# Patient Record
Sex: Male | Born: 1960 | Race: White | Hispanic: No | Smoking: Former smoker
Health system: Southern US, Community
[De-identification: ages and names within clinical notes are randomized; demographics above are authoritative.]

## PROBLEM LIST (undated history)

## (undated) DIAGNOSIS — J189 Pneumonia, unspecified organism: Secondary | ICD-10-CM

## (undated) DIAGNOSIS — I2699 Other pulmonary embolism without acute cor pulmonale: Secondary | ICD-10-CM

## (undated) DIAGNOSIS — B029 Zoster without complications: Secondary | ICD-10-CM

## (undated) DIAGNOSIS — I82431 Acute embolism and thrombosis of right popliteal vein: Secondary | ICD-10-CM

## (undated) HISTORY — DX: Acute embolism and thrombosis of right popliteal vein: I82.431

## (undated) HISTORY — PX: KNEE SURGERY: SHX244

## (undated) HISTORY — PX: HAND SURGERY: SHX662

---

## 2007-03-21 DIAGNOSIS — J189 Pneumonia, unspecified organism: Secondary | ICD-10-CM

## 2007-03-21 HISTORY — DX: Pneumonia, unspecified organism: J18.9

## 2011-07-06 ENCOUNTER — Emergency Department (HOSPITAL_COMMUNITY): Payer: Medicaid Other | Admitting: Critical Care Medicine

## 2011-07-06 ENCOUNTER — Encounter (HOSPITAL_COMMUNITY): Payer: Self-pay | Admitting: Critical Care Medicine

## 2011-07-06 ENCOUNTER — Emergency Department (HOSPITAL_COMMUNITY): Payer: Medicaid Other

## 2011-07-06 ENCOUNTER — Emergency Department (HOSPITAL_COMMUNITY)
Admission: EM | Admit: 2011-07-06 | Discharge: 2011-07-07 | Disposition: A | Discharge status: Home / Self Care | Payer: Medicaid Other | Attending: Emergency Medicine | Admitting: Emergency Medicine

## 2011-07-06 ENCOUNTER — Encounter (HOSPITAL_COMMUNITY): Payer: Self-pay | Admitting: *Deleted

## 2011-07-06 ENCOUNTER — Encounter (HOSPITAL_COMMUNITY)
Admission: EM | Disposition: A | Discharge status: Home / Self Care | Payer: Self-pay | Source: Home / Self Care | Attending: Emergency Medicine

## 2011-07-06 DIAGNOSIS — S61219A Laceration without foreign body of unspecified finger without damage to nail, initial encounter: Secondary | ICD-10-CM

## 2011-07-06 DIAGNOSIS — Z23 Encounter for immunization: Secondary | ICD-10-CM | POA: Insufficient documentation

## 2011-07-06 DIAGNOSIS — W312XXA Contact with powered woodworking and forming machines, initial encounter: Secondary | ICD-10-CM | POA: Insufficient documentation

## 2011-07-06 DIAGNOSIS — Y92009 Unspecified place in unspecified non-institutional (private) residence as the place of occurrence of the external cause: Secondary | ICD-10-CM | POA: Insufficient documentation

## 2011-07-06 DIAGNOSIS — S62609B Fracture of unspecified phalanx of unspecified finger, initial encounter for open fracture: Secondary | ICD-10-CM

## 2011-07-06 DIAGNOSIS — S62639B Displaced fracture of distal phalanx of unspecified finger, initial encounter for open fracture: Secondary | ICD-10-CM | POA: Insufficient documentation

## 2011-07-06 HISTORY — PX: INCISION AND DRAINAGE OF WOUND: SHX1803

## 2011-07-06 LAB — CBC
MCHC: 33.9 g/dL (ref 30.0–36.0)
Platelets: 308 10*3/uL (ref 150–400)
RDW: 13.1 % (ref 11.5–15.5)
WBC: 10.3 10*3/uL (ref 4.0–10.5)

## 2011-07-06 LAB — DIFFERENTIAL
Basophils Absolute: 0 10*3/uL (ref 0.0–0.1)
Basophils Relative: 0 % (ref 0–1)
Lymphocytes Relative: 30 % (ref 12–46)
Neutro Abs: 6 10*3/uL (ref 1.7–7.7)
Neutrophils Relative %: 58 % (ref 43–77)

## 2011-07-06 LAB — BASIC METABOLIC PANEL
BUN: 17 mg/dL (ref 6–23)
GFR calc Af Amer: >90 mL/min (ref >90)
GFR calc non Af Amer: >90 mL/min (ref >90)
Potassium: 3.6 mEq/L (ref 3.5–5.1)
Sodium: 137 mEq/L (ref 135–145)

## 2011-07-06 SURGERY — OPEN REDUCTION INTERNAL FIXATION (ORIF) HAND
Anesthesia: General | Site: Hand | Laterality: Left | Wound class: Contaminated

## 2011-07-06 MED ORDER — DEXTROSE 5 % IV SOLN
INTRAVENOUS | Status: DC | PRN
  Administered 2011-07-06: 22:00:00 via INTRAVENOUS

## 2011-07-06 MED ORDER — CEFAZOLIN SODIUM-DEXTROSE 2-3 GM-% IV SOLR
2.0000 g | Freq: Once | INTRAVENOUS | Status: AC
  Administered 2011-07-06: 2 g via INTRAVENOUS
  Filled 2011-07-06: qty 50

## 2011-07-06 MED ORDER — PROPOFOL 10 MG/ML IV EMUL
INTRAVENOUS | Status: DC | PRN
  Administered 2011-07-06: 200 mg via INTRAVENOUS

## 2011-07-06 MED ORDER — ONDANSETRON HCL 4 MG/2ML IJ SOLN
4.0000 mg | Freq: Once | INTRAMUSCULAR | Status: AC
  Administered 2011-07-06: 4 mg via INTRAVENOUS
  Filled 2011-07-06: qty 2

## 2011-07-06 MED ORDER — OXYCODONE-ACETAMINOPHEN 5-325 MG PO TABS: ORAL_TABLET | ORAL | Status: AC

## 2011-07-06 MED ORDER — FENTANYL CITRATE 0.05 MG/ML IJ SOLN
50.0000 ug | Freq: Once | INTRAMUSCULAR | Status: AC
  Administered 2011-07-06: 50 ug via INTRAVENOUS
  Filled 2011-07-06: qty 2

## 2011-07-06 MED ORDER — BUPIVACAINE HCL (PF) 0.25 % IJ SOLN
INTRAMUSCULAR | Status: DC | PRN
  Administered 2011-07-06: 20 mL

## 2011-07-06 MED ORDER — TETANUS-DIPHTH-ACELL PERTUSSIS 5-2.5-18.5 LF-MCG/0.5 IM SUSP
0.5000 mL | Freq: Once | INTRAMUSCULAR | Status: AC
  Administered 2011-07-06: 0.5 mL via INTRAMUSCULAR
  Filled 2011-07-06: qty 0.5

## 2011-07-06 MED ORDER — FENTANYL CITRATE 0.05 MG/ML IJ SOLN
50.0000 ug | Freq: Once | INTRAMUSCULAR | Status: AC
  Administered 2011-07-06: 50 ug via INTRAVENOUS

## 2011-07-06 MED ORDER — HYDROMORPHONE HCL PF 1 MG/ML IJ SOLN
1.0000 mg | Freq: Once | INTRAMUSCULAR | Status: AC
  Administered 2011-07-06: 1 mg via INTRAVENOUS
  Filled 2011-07-06: qty 1

## 2011-07-06 MED ORDER — FENTANYL CITRATE 0.05 MG/ML IJ SOLN
INTRAMUSCULAR | Status: DC | PRN
  Administered 2011-07-06: 150 ug via INTRAVENOUS
  Administered 2011-07-06: 100 ug via INTRAVENOUS
  Administered 2011-07-06: 50 ug via INTRAVENOUS
  Administered 2011-07-06: 100 ug via INTRAVENOUS

## 2011-07-06 MED ORDER — LACTATED RINGERS IV SOLN
INTRAVENOUS | Status: DC | PRN
  Administered 2011-07-06 (×2): via INTRAVENOUS

## 2011-07-06 MED ORDER — CEFAZOLIN SODIUM 1-5 GM-% IV SOLN
INTRAVENOUS | Status: DC | PRN
  Administered 2011-07-06: 1 g via INTRAVENOUS

## 2011-07-06 MED ORDER — MIDAZOLAM HCL 5 MG/5ML IJ SOLN
INTRAMUSCULAR | Status: DC | PRN
  Administered 2011-07-06: 2 mg via INTRAVENOUS

## 2011-07-06 MED ORDER — GLYCOPYRROLATE 0.2 MG/ML IJ SOLN
INTRAMUSCULAR | Status: DC | PRN
  Administered 2011-07-06: .8 mg via INTRAVENOUS

## 2011-07-06 MED ORDER — ONDANSETRON HCL 4 MG/2ML IJ SOLN
INTRAMUSCULAR | Status: DC | PRN
  Administered 2011-07-06: 4 mg via INTRAVENOUS

## 2011-07-06 MED ORDER — ROCURONIUM BROMIDE 100 MG/10ML IV SOLN
INTRAVENOUS | Status: DC | PRN
  Administered 2011-07-06: 50 mg via INTRAVENOUS

## 2011-07-06 MED ORDER — SULFAMETHOXAZOLE-TRIMETHOPRIM 800-160 MG PO TABS: 1.0000 | ORAL_TABLET | Freq: Two times a day (BID) | ORAL | Status: AC

## 2011-07-06 MED ORDER — NEOSTIGMINE METHYLSULFATE 1 MG/ML IJ SOLN
INTRAMUSCULAR | Status: DC | PRN
  Administered 2011-07-06: 4 mg via INTRAVENOUS

## 2011-07-06 MED ORDER — FENTANYL CITRATE 0.05 MG/ML IJ SOLN
50.0000 ug | Freq: Once | INTRAMUSCULAR | Status: AC
  Filled 2011-07-06: qty 2

## 2011-07-06 MED ORDER — SUCCINYLCHOLINE CHLORIDE 20 MG/ML IJ SOLN
INTRAMUSCULAR | Status: DC | PRN
  Administered 2011-07-06: 120 mg via INTRAVENOUS

## 2011-07-06 SURGICAL SUPPLY — 30 items
BANDAGE ELASTIC 3 VELCRO ST LF (GAUZE/BANDAGES/DRESSINGS) ×3 IMPLANT
BANDAGE GAUZE ELAST BULKY 4 IN (GAUZE/BANDAGES/DRESSINGS) ×3 IMPLANT
CUFF TOURNIQUET SINGLE 18IN (TOURNIQUET CUFF) ×3 IMPLANT
DRAPE SURG 17X23 STRL (DRAPES) ×3 IMPLANT
DRSG EMULSION OIL 3X3 NADH (GAUZE/BANDAGES/DRESSINGS) ×3 IMPLANT
GAUZE XEROFORM 1X8 LF (GAUZE/BANDAGES/DRESSINGS) ×3 IMPLANT
GLOVE BIO SURGEON STRL SZ 6 (GLOVE) ×3 IMPLANT
GLOVE BIOGEL PI IND STRL 6.5 (GLOVE) ×2 IMPLANT
GLOVE BIOGEL PI IND STRL 8 (GLOVE) ×2 IMPLANT
GLOVE BIOGEL PI INDICATOR 6.5 (GLOVE) ×1
GLOVE BIOGEL PI INDICATOR 8 (GLOVE) ×1
GLOVE ORTHO TXT STRL SZ7.5 (GLOVE) ×3 IMPLANT
GOWN PREVENTION PLUS XLARGE (GOWN DISPOSABLE) ×3 IMPLANT
GOWN STRL REIN XL XLG (GOWN DISPOSABLE) ×3 IMPLANT
HAND ALUMI LG (SOFTGOODS) ×3 IMPLANT
KIT BASIN OR (CUSTOM PROCEDURE TRAY) ×3 IMPLANT
KWIRE 4.0 X .035IN (WIRE) ×6 IMPLANT
MATRIX SURGICAL PSMX 10X15CM (Tissue) ×3 IMPLANT
MICROMATRIX 500MG (Tissue) ×3 IMPLANT
PACK ORTHO EXTREMITY (CUSTOM PROCEDURE TRAY) ×3 IMPLANT
SCRUB BETADINE 4OZ XXX (MISCELLANEOUS) ×3 IMPLANT
SET CYSTO W/LG BORE CLAMP LF (SET/KITS/TRAYS/PACK) ×3 IMPLANT
SOLUTION BETADINE 4OZ (MISCELLANEOUS) ×3 IMPLANT
SOLUTION PARTIC MCRMTRX 500MG (Tissue) ×2 IMPLANT
SPLINT PLASTER EXTRA FAST 3X15 (CAST SUPPLIES) ×10
SPLINT PLASTER GYPS XFAST 3X15 (CAST SUPPLIES) ×20 IMPLANT
SPONGE GAUZE 4X4 12PLY (GAUZE/BANDAGES/DRESSINGS) ×3 IMPLANT
SUT ETHILON 5 0 PS 2 18 (SUTURE) ×3 IMPLANT
SUT MON AB 5-0 PS2 18 (SUTURE) ×6 IMPLANT
SUT VICRYL 4-0 PS2 18IN ABS (SUTURE) ×3 IMPLANT

## 2011-07-06 NOTE — ED Notes (Signed)
Pt. s wife had to go home.  Please call her when pt. Is ready for discharge./  [phone]/Home or Cell is [phone]

## 2011-07-06 NOTE — ED Notes (Signed)
Pt. Arrived via ambulance, [facility], with a lt. Hand injury from a Table Saw.  Pt. Has a dressing intact and no bleeding noted.   Pt. Is alert and oriented X3.  Pt. Is aware of meeting Dr. Jessy Oto for surgery.  I explained to pt. That we would keep him comfortable until Dr. Jessy Oto arrives.

## 2011-07-06 NOTE — ED Notes (Signed)
Placed call to OR. States that they should be calling for him within the hour. Pt requesting pain medication. NP notified.

## 2011-07-06 NOTE — Anesthesia Preprocedure Evaluation (Addendum)
Anesthesia Evaluation  Patient identified by MRN, date of birth, ID band Patient awake    Reviewed: Allergy & Precautions, H&P , NPO status , Patient's Chart, lab work & pertinent test results  Airway Mallampati: I TM Distance: >3 FB Neck ROM: Full    Dental  (+) Dental Advisory Given and Chipped,    Pulmonary former smoker breath sounds clear to auscultation  Pulmonary exam normal       Cardiovascular negative cardio ROS  Rhythm:Regular Rate:Normal     Neuro/Psych negative neurological ROS     GI/Hepatic Neg liver ROS, GERD-  Poorly Controlled,  Endo/Other  negative endocrine ROS  Renal/GU negative Renal ROS     Musculoskeletal  (+) Arthritis -,   Abdominal (+) + obese,   Peds  Hematology negative hematology ROS (+)   Anesthesia Other Findings   Reproductive/Obstetrics                          Anesthesia Physical Anesthesia Plan  ASA: II and Emergent  Anesthesia Plan: General   Post-op Pain Management:    Induction: Intravenous  Airway Management Planned: Oral ETT  Additional Equipment:   Intra-op Plan:   Post-operative Plan: Extubation in OR  Informed Consent: I have reviewed the patients History and Physical, chart, labs and discussed the procedure including the risks, benefits and alternatives for the proposed anesthesia with the patient or authorized representative who has indicated his/her understanding and acceptance.   Dental advisory given  Plan Discussed with: CRNA, Anesthesiologist and Surgeon  Anesthesia Plan Comments: (Plan routine monitors, GETA)      Anesthesia Quick Evaluation

## 2011-07-06 NOTE — Op Note (Signed)
Dictation [id number]

## 2011-07-06 NOTE — ED Provider Notes (Signed)
History  This chart was scribed for Flint Melter, MD by Cherlynn Perches. The patient was seen in room APA19/[facility]19. Patient's care was started at 1040.   CSN: [id number]  Arrival date & time 07/06/11  1040   None     Chief Complaint  Patient presents with  . Extremity Laceration    (Consider location/radiation/quality/duration/timing/severity/associated sxs/prior treatment) HPI [name] is a 50 y.o. male who presents to the Emergency Department complaining of severe laceration to left hand involving 3rd, 4th, and 5th fingers sustained just prior to arrival while using a table saw. Third fingers is partially amputated. Pressure applied prior to arrival. Denies associated syncope or additional injuries. Tetanus not up to date.    History reviewed. No pertinent past medical history.  History reviewed. No pertinent past surgical history.  No family history on file.  History  Substance Use Topics  . Smoking status: Never Smoker   . Smokeless tobacco: Not on file  . Alcohol Use: No      Review of Systems A complete 10 system review of systems was obtained and all systems are negative except as noted in the HPI and PMH.    Allergies  Review of patient's allergies indicates no known allergies.  Home Medications  No current outpatient prescriptions on file.  BP 145/94  Pulse 60  Temp(Src) 98.6 F (37 C) (Oral)  Resp 20  SpO2 97%  Physical Exam  Nursing note and vitals reviewed. Constitutional: He is oriented to person, place, and time. He appears well-developed and well-nourished.  HENT:  Head: Normocephalic and atraumatic.  Right Ear: External ear normal.  Left Ear: External ear normal.  Eyes: Conjunctivae and EOM are normal. Pupils are equal, round, and reactive to Duzan.  Neck: Normal range of motion and phonation normal. Neck supple.  Cardiovascular: Normal rate, regular rhythm, normal heart sounds and intact distal pulses.   Pulmonary/Chest: Effort  normal and breath sounds normal. He exhibits no bony tenderness.  Abdominal: Soft. Normal appearance. There is no tenderness.  Musculoskeletal: Normal range of motion.       Left 3rd finger - Large laceration on middle aspect of finger extending tangentially with 60% amputation. Fingertip is ischemic. Left 4th finger - evulsion at PIP joint laterally, sparing nail Left 5th finger - dorsal laceration at PIP joint.  Neurological: He is alert and oriented to person, place, and time. He has normal strength. No cranial nerve deficit or sensory deficit. He exhibits normal muscle tone. Coordination normal.  Skin: Skin is warm, dry and intact.  Psychiatric: He has a normal mood and affect. His behavior is normal. Judgment and thought content normal.    ED Course  Procedures (including critical care time)  DIAGNOSTIC STUDIES:   COORDINATION OF CARE: 10:53AM - Discussed need for hand surgery today with pt. Patient understands and agrees with initial ED impression and plan with expectations set for ED visit. 11:39AM- Consult complete with hand surgeon. Patient case explained and discussed.   Emergency department treatment: IV fluids, IV, Dilaudid, IV, Ancef, and TDAP.    Labs Reviewed  BASIC METABOLIC PANEL - Abnormal; Notable for the following:    Glucose, Bld 120 (*)    All other components within normal limits  CBC  DIFFERENTIAL   Dg Hand Complete Left  07/06/2011  *RADIOLOGY REPORT*  Clinical Data: Pain and laceration after table saw injury  LEFT HAND - COMPLETE 3+ VIEW  Comparison: None.  Findings: There is a comminuted fracture of the third distal  phalanx.  There are also chip fractures of the distal third middle phalanx and the distal fourth middle phalanx and the proximal fourth distal phalanx.  Overlying soft tissue irregularity is present.  IMPRESSION: There are fractures involving the third and fourth distal and middle phalanges.  Original Report Authenticated By: Brandon Melnick,  M.D.     1. Open finger fracture   2. Finger laceration       MDM  Accidental injury, left fingers 3,4,5 from table saw encounter. He needs surgical repair by a specialist. No other apparent injuries. The right distal tip near complete amputation will likely need revision and shortening. The remaining tissue is nonviable.      I personally performed the services described in this documentation, which was scribed in my presence. The recorded information has been reviewed and considered.    Flint Melter, MD 07/06/11 [phone]

## 2011-07-06 NOTE — Preoperative (Signed)
Beta Blockers   Reason not to administer Beta Blockers:Not Applicable 

## 2011-07-06 NOTE — Anesthesia Procedure Notes (Addendum)
Procedure Name: Intubation Date/Time: 07/06/2011 10:15 PM Performed by: Alexah Kivett S Pre-anesthesia Checklist: Patient identified, Emergency Drugs available, Suction available, Patient being monitored and Timeout performed Patient Re-evaluated:Patient Re-evaluated prior to inductionOxygen Delivery Method: Circle system utilized Preoxygenation: Pre-oxygenation with 100% oxygen Intubation Type: IV induction Laryngoscope Size: Mac and 4 Grade View: Grade I Tube type: Oral Tube size: 7.5 mm Number of attempts: 1 Airway Equipment and Method: Stylet Placement Confirmation: ETT inserted through vocal cords under direct vision,  positive ETCO2 and breath sounds checked- equal and bilateral Secured at: 22 cm Tube secured with: Tape Dental Injury: Teeth and Oropharynx as per pre-operative assessment    Performed by: Shellsea Borunda S

## 2011-07-06 NOTE — ED Notes (Signed)
Pt transported to OR

## 2011-07-06 NOTE — ED Notes (Signed)
Jantzen Pilger, Pt.s wife, [phone] Home number and Cell phone [phone]

## 2011-07-06 NOTE — ED Notes (Signed)
NPO maintained.

## 2011-07-06 NOTE — Discharge Instructions (Signed)

## 2011-07-06 NOTE — H&P (Signed)
[name] is an 51 y.o. male.   Chief Complaint: tablesaw injury left hand HPI: 51 yo rhd male injured left long, ring, and small fingers.  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  Tissue loss on dorsoulnar aspect of ring.  Reports no previous injuries to left hand and no other injuries at this time.  History reviewed. No pertinent past medical history.  History reviewed. No pertinent past surgical history.  No family history on file. Social History:  reports that he has never smoked. He does not have any smokeless tobacco history on file. He reports that he does not drink alcohol or use illicit drugs.  Allergies: No Known Allergies  Medications Prior to Admission  Medication Dose Route Frequency Provider Last Rate Last Dose  . ceFAZolin (ANCEF) IVPB 2 g/50 mL premix  2 g Intravenous Once Flint Melter, MD   2 g at 07/06/11 1207  . fentaNYL (SUBLIMAZE) injection 50 mcg  50 mcg Intravenous Once Fayrene Helper, PA-C   50 mcg at 07/06/11 1604  . fentaNYL (SUBLIMAZE) injection 50 mcg  50 mcg Intravenous Once Tami Ribas, MD   50 mcg at 07/06/11 1703  . fentaNYL (SUBLIMAZE) injection 50 mcg  50 mcg Intravenous Once Tami Ribas, MD      . HYDROmorphone (DILAUDID) injection 1 mg  1 mg Intravenous Once Flint Melter, MD   1 mg at 07/06/11 1110  . HYDROmorphone (DILAUDID) injection 1 mg  1 mg Intravenous Once Flint Melter, MD   1 mg at 07/06/11 1155  . HYDROmorphone (DILAUDID) injection 1 mg  1 mg Intravenous Once Fayrene Helper, PA-C   1 mg at 07/06/11 1336  . ondansetron (ZOFRAN) injection 4 mg  4 mg Intravenous Once Flint Melter, MD   4 mg at 07/06/11 1110  . ondansetron (ZOFRAN) injection 4 mg  4 mg Intravenous Once Fayrene Helper, PA-C   4 mg at 07/06/11 1338  . TDaP (BOOSTRIX) injection 0.5 mL  0.5 mL Intramuscular Once Flint Melter, MD   0.5 mL at 07/06/11 1227   No current outpatient prescriptions on file as of 07/06/2011.    Results for orders placed during the hospital encounter of  07/06/11 (from the past 48 hour(s))  CBC     Status: Normal   Collection Time   07/06/11 11:11 AM      Component Value Range Comment   WBC 10.3  4.0 - 10.5 (K/uL)    RBC 5.02  4.22 - 5.81 (MIL/uL)    Hemoglobin 15.4  13.0 - 17.0 (g/dL)    HCT 91.4  78.2 - 95.6 (%)    MCV 90.4  78.0 - 100.0 (fL)    MCH 30.7  26.0 - 34.0 (pg)    MCHC 33.9  30.0 - 36.0 (g/dL)    RDW 21.3  08.6 - 57.8 (%)    Platelets 308  150 - 400 (K/uL)   DIFFERENTIAL     Status: Normal   Collection Time   07/06/11 11:11 AM      Component Value Range Comment   Neutrophils Relative 58  43 - 77 (%)    Neutro Abs 6.0  1.7 - 7.7 (K/uL)    Lymphocytes Relative 30  12 - 46 (%)    Lymphs Abs 3.1  0.7 - 4.0 (K/uL)    Monocytes Relative 9  3 - 12 (%)    Monocytes Absolute 0.9  0.1 - 1.0 (K/uL)    Eosinophils Relative 3  0 -  5 (%)    Eosinophils Absolute 0.3  0.0 - 0.7 (K/uL)    Basophils Relative 0  0 - 1 (%)    Basophils Absolute 0.0  0.0 - 0.1 (K/uL)    WBC Morphology ATYPICAL LYMPHOCYTES     BASIC METABOLIC PANEL     Status: Abnormal   Collection Time   07/06/11 11:11 AM      Component Value Range Comment   Sodium 137  135 - 145 (mEq/L)    Potassium 3.6  3.5 - 5.1 (mEq/L)    Chloride 103  96 - 112 (mEq/L)    CO2 22  19 - 32 (mEq/L)    Glucose, Bld 120 (*) 70 - 99 (mg/dL)    BUN 17  6 - 23 (mg/dL)    Creatinine, Ser 1.61  0.50 - 1.35 (mg/dL)    Calcium 9.7  8.4 - 10.5 (mg/dL)    GFR calc non Af Amer >90  >90 (mL/min)    GFR calc Af Amer >90  >90 (mL/min)     Dg Hand Complete Left  07/06/2011  *RADIOLOGY REPORT*  Clinical Data: Pain and laceration after table saw injury  LEFT HAND - COMPLETE 3+ VIEW  Comparison: None.  Findings: There is a comminuted fracture of the third distal phalanx.  There are also chip fractures of the distal third middle phalanx and the distal fourth middle phalanx and the proximal fourth distal phalanx.  Overlying soft tissue irregularity is present.  IMPRESSION: There are fractures  involving the third and fourth distal and middle phalanges.  Original Report Authenticated By: Brandon Melnick, M.D.     A comprehensive review of systems was negative.  Blood pressure 133/88, pulse 62, temperature 98.5 F (36.9 C), temperature source Oral, resp. rate 17, SpO2 97.00%.  General appearance: alert, cooperative and appears stated age Head: Normocephalic, without obvious abnormality, atraumatic Neck: supple, symmetrical, trachea midline Resp: clear to auscultation bilaterally Cardio: regular rate and rhythm GI: soft, non-tender; bowel sounds normal; no masses,  no organomegaly Extremities: Space touch sensation and capillary refill intact all fingers except left long finger which has no sensation.  near amputation of long finger at base of nail.  ring finger with tissue loss on dorsoulnar aspect of finger with bone exposed.  active flexion/extension at dip joint.  small finger with wound on volar and dorsal aspect.  active flexion/extension at dip. Pulses: 2+ and symmetric Skin: as above Neurologic: Grossly normal Incision/Wound: As above  Assessment/Plan Left thumb, long, ring, small finger table saw injury.  Thumb and small finger wounds seem into subcutaneous tissue.  Recommend OR for I&Dof wounds, revision amputation long finger, possible skin graft or acell placement on ring finger.  Risks, benefits, and alternatives of surgery were discussed and the patient agrees with the plan of care.   Marelin Tat R 07/06/2011, 5:10 PM

## 2011-07-06 NOTE — ED Provider Notes (Signed)
Pt sent from [facility] to CDU to be seen by hand specialist Dr. Merlyn Lot.  Pt suffered lacerations to his L hand involving 3rd,4th,5th fingers when accidentally cut by a table saw.  Pt receive tetanus shot, pain meds, and Ancef prior to arrival.  He is currently in NAD and pain is controlled.  Will obtain 12 lead ECG.  Will continue to monitor.  VSS.  Pt made NPO  1:19 PM Pt request for pain medication, dilaudid 1mg  and zofran 4mg  given via IV.  Dr. Merlyn Lot is aware that pt is here.     Date: 07/06/2011  Rate: 51  Rhythm: sinus bradycardia  QRS Axis: normal  Intervals: normal  ST/T Wave abnormalities: normal  Conduction Disutrbances:none  Narrative Interpretation:   Old EKG Reviewed: none available  2:38 PM Pt sts his pain is spiking, but he prefers another type of pain medication aside from dilaudid as it makes him uncomfortable.  WIll give Fentanyl IV for pain control.  Pt is lucid and appears to be A&O and in NAD at this time.  Mild bradycardia noted but stable BP.    Fayrene Helper, PA-C 07/07/11 1504

## 2011-07-06 NOTE — ED Notes (Signed)
Call pts wife after surgery to update.

## 2011-07-06 NOTE — Brief Op Note (Signed)
07/06/2011  11:49 PM  PATIENT:  Daphane Shepherd  51 y.o. male  PRE-OPERATIVE DIAGNOSIS:  saw injury  POST-OPERATIVE DIAGNOSIS:  saw injury  PROCEDURE:  Procedure(s) (LRB): IRRIGATION AND DEBRIDEMENT WOUND (Left) OPEN REDUCTION INTERNAL FIXATION (ORIF) HAND (Left)  SURGEON:  Surgeon(s) and Role:    * Tami Ribas, MD - Primary  PHYSICIAN ASSISTANT:   ASSISTANTS: none   ANESTHESIA:   general  EBL:  Total I/O In: 1250 [I.V.:1250] Out: -   BLOOD ADMINISTERED:none  DRAINS: none   LOCAL MEDICATIONS USED:  MARCAINE     SPECIMEN:  No Specimen  DISPOSITION OF SPECIMEN:  N/A  COUNTS:  YES  TOURNIQUET:  * Missing tourniquet times found for documented tourniquets in log:  [id number] *  DICTATION: .Other Dictation: Dictation Number [id number]  PLAN OF CARE: Discharge to home after PACU  PATIENT DISPOSITION:  PACU - hemodynamically stable.

## 2011-07-06 NOTE — ED Notes (Signed)
Cut left middle, ring, and pinky fingers with table saw.  Pt reports loosing significant amt of blood.  Pt has bone exposed to middle finger.

## 2011-07-07 ENCOUNTER — Encounter (HOSPITAL_COMMUNITY): Payer: Self-pay | Admitting: Orthopedic Surgery

## 2011-07-07 MED ORDER — HYDROMORPHONE HCL PF 1 MG/ML IJ SOLN: 0.2500 mg | INTRAMUSCULAR | Status: DC | PRN

## 2011-07-07 NOTE — Anesthesia Postprocedure Evaluation (Signed)
  Anesthesia Post-op Note  Patient: [name]  Procedure(s) Performed: Procedure(s) (LRB): IRRIGATION AND DEBRIDEMENT WOUND (Left) OPEN REDUCTION INTERNAL FIXATION (ORIF) HAND (Left)  Patient Location: PACU  Anesthesia Type: General  Level of Consciousness: awake, alert  and oriented  Airway and Oxygen Therapy: Patient Spontanous Breathing  Post-op Pain: none  Post-op Assessment: Post-op Vital signs reviewed, Patient's Cardiovascular Status Stable, Respiratory Function Stable, Patent Airway, No signs of Nausea or vomiting and Pain level controlled  Post-op Vital Signs: Reviewed and stable  Complications: No apparent anesthesia complications

## 2011-07-07 NOTE — Op Note (Signed)
[name], [name] NO.:  [id number]  MEDICAL RECORD NO.:  [id number]  LOCATION:  [facility]                         FACILITY:  [facility]  PHYSICIAN:  Betha Loa, MD        DATE OF BIRTH:  April 19, 1960  DATE OF PROCEDURE:  07/06/2011 DATE OF DISCHARGE:  07/07/2011                              OPERATIVE REPORT   PREOPERATIVE DIAGNOSIS:  Left long ring and small finger table saw injuries.  POSTOPERATIVE DIAGNOSES:  Left long finger open fracture distal phalanx with bone loss on ulnar side of distal interphalangeal joint.  Left ring finger open fracture of middle and distal phalanges with bone loss on ulnar side of distal interphalangeal joint with soft tissue loss and left small finger open fracture of distal phalanx.  PROCEDURE:   1. Left long, ring, and small finger irrigation and debridement of open fractures 2. Left long finger revision amputation 3. Left ring finger Acell graft placement 4. Left small finger open reduction and pinning of open distal phalanx fracture  SURGEON:  Betha Loa, MD  ASSISTANT:  None.  ANESTHESIA:  General.  IV FLUIDS:  Per anesthesia flow sheet.  ESTIMATED BLOOD LOSS:  Minimal.  COMPLICATIONS:  None.  SPECIMENS:  None.  TOURNIQUET TIME:  73 minutes.  DISPOSITION:  Stable to PACU.  INDICATIONS:  [name] is a 51 year old right-hand dominant male who was using a table saw this afternoon when he injured his left long ring and small fingers on the saw blade.  He was seen at [facility] and transferred to [facility] for further care.  On evaluation, he had a near amputation of the distal end of the left long finger.  He had a soft tissue loss on the ulnar side of the DIP joint of the ring finger with exposed bone and he had lacerations to the distal phalanx of the small finger.  I recommended to [name] going to the operating room for irrigation and debridement of the wounds, revision and amputation of the long finger,  Acell graft placement of the ring finger.  Risks, benefits and alternatives of surgery were discussed including risk of blood loss; infection; damage to nerves, vessels, tendons, ligaments, bone; failure of surgery; need for additional surgery; complications; wound healing; continued pain; need for further surgeries.  He voiced understanding of these risks and elected to proceed.  OPERATIVE COURSE:  After being identified preoperatively by myself, the patient and I agreed upon the procedure and site of procedure.  The surgical site was marked.  The risks, benefits, and alternatives of surgery were reviewed and he wished to proceed.  Surgical consent had been signed.  He had been given Ancef in the emergency department and his tetanus was updated.  His Ancef was redosed prior to going back to the OR.  He was transferred to the operating room and placed on the operating room table in supine position with the left upper extremity on an armboard.  General anesthesia was induced by the anesthesiologist. Left upper extremity was prepped and draped in normal sterile orthopedic fashion.  Surgical pause was performed between the surgeons, anesthesia, and operating room staff and all were  in agreement as to the patient, procedure, and site of procedure.  Tourniquet to the proximal aspect of the extremity was inflated to 250 mmHg after exsanguination of the limb with an Esmarch bandage.  All wounds were copiously irrigated with 3000 mL of sterile saline by cysto tubing.  There was a small amount of soft tissue loss of the pad of the thumb and it was just into the subcutaneous tissues.  In the long finger, there was a significant amount of bone loss from the distal phalanx.  The ulnar side of the DIP joint had been lost as well including the collateral ligament.  The ring finger also had loss of bone at the ulnar side of the DIP joint with loss of collateral ligament.  There was exposed bone in this  area and loss of soft tissue.  In the small finger, there was a laceration of the pad of the finger with a soft tissue flap covering it.  There was a laceration on the dorsum of the finger that went down to the bone. There was an open fracture in this area as well.  After copious irrigation and debridement, revision amputation was performed of the long finger.  There was adequate soft tissue to cover over the bone. The distal phalanx was shortened.  The flexor tendon and extensor tendon insertions were left intact.  The germinal matrix and nail bed were excised in their entirety.  A 5-0 Monocryl suture was used to close the wound.  In the small finger, open reduction and percutaneous pinning of the fracture was performed with a 0.035-inch K-wire.  C-arm was used in AP and lateral projections to ensure appropriate reduction and position of the hardware, which was the case.  The pin was advanced across the distal phalanx and into the proximal phalanx across the DIP joint with the DIP joint in full extension.  This adequately reduced the fracture.  The pin was bent and cut short.  In the ring finger, some of the soft tissue was able to be pulled back over the middle phalanx. There was still exposed bone.  Acell graft was selected.  The powder was placed in the wound and the sheet graft was sewn over top with 5-0 Monocryl suture in an interrupted fashion.  The graft had been fenestrated. This provided good coverage. The wounds in the long and small finger were dressed with sterile Xeroform and 4x4s.  The Acell graft was dressed with an Adaptic, Surgilube and a moist 4x4 and then dry 4x4s on top.  All fingers were wrapped with Kerlix lightly.  A volar splint was placed and wrapped with Kerlix and Ace bandage.  Tourniquet was deflated at 73 minutes.  The fingertips were pink with brisk capillary refill after deflation of the tourniquet.  The operative drapes were broken down and the patient  was awakened from anesthesia safely.  Digital blocks to the long ring and small fingers had been performed with 20 mL of 0.25% plain Marcaine to aid in postoperative analgesia.  The patient was transferred back to the stretcher and taken to PACU in stable condition.  I will see him back in the office in 1 week for postoperative followup.  I will give him Percocet 5/325 1-2 p.o. q.6 hours p.r.n. pain, dispensed #50 and Bactrim DS 1 p.o. b.i.d. x7 days.     Betha Loa, MD     KK/MEDQ  D:  07/06/2011  T:  07/07/2011  Job:  [id number]

## 2011-07-07 NOTE — Transfer of Care (Signed)
Immediate Anesthesia Transfer of Care Note  Patient: [name]  Procedure(s) Performed: Procedure(s) (LRB): IRRIGATION AND DEBRIDEMENT WOUND (Left) OPEN REDUCTION INTERNAL FIXATION (ORIF) HAND (Left)  Patient Location: PACU  Anesthesia Type: General  Level of Consciousness: awake, alert  and oriented  Airway & Oxygen Therapy: Patient Spontanous Breathing and Patient connected to nasal cannula oxygen  Post-op Assessment: Report given to PACU RN and Post -op Vital signs reviewed and stable  Post vital signs: Reviewed and stable  Complications: No apparent anesthesia complications

## 2011-07-10 NOTE — ED Provider Notes (Signed)
Medical screening examination/treatment/procedure(s) were performed by non-physician practitioner and as supervising physician I was immediately available for consultation/collaboration.  Raeford Razor, MD 07/10/11 [phone]

## 2013-02-24 ENCOUNTER — Other Ambulatory Visit (HOSPITAL_COMMUNITY): Payer: Self-pay | Admitting: Nurse Practitioner

## 2013-02-24 DIAGNOSIS — R0602 Shortness of breath: Secondary | ICD-10-CM

## 2013-02-24 DIAGNOSIS — R911 Solitary pulmonary nodule: Secondary | ICD-10-CM

## 2013-02-24 DIAGNOSIS — R042 Hemoptysis: Secondary | ICD-10-CM

## 2013-02-25 ENCOUNTER — Ambulatory Visit (HOSPITAL_COMMUNITY)
Admission: RE | Admit: 2013-02-25 | Discharge: 2013-02-25 | Disposition: A | Discharge status: Home / Self Care | Payer: Medicaid Other | Source: Ambulatory Visit | Attending: Nurse Practitioner | Admitting: Nurse Practitioner

## 2013-02-25 ENCOUNTER — Encounter (HOSPITAL_COMMUNITY): Payer: Self-pay | Admitting: Emergency Medicine

## 2013-02-25 ENCOUNTER — Observation Stay (HOSPITAL_COMMUNITY): Payer: Medicaid Other

## 2013-02-25 ENCOUNTER — Encounter (HOSPITAL_COMMUNITY): Payer: Self-pay

## 2013-02-25 ENCOUNTER — Observation Stay (HOSPITAL_COMMUNITY)
Admission: EM | Admit: 2013-02-25 | Discharge: 2013-02-26 | Disposition: A | Discharge status: Home / Self Care | Payer: Medicaid Other | Attending: Internal Medicine | Admitting: Internal Medicine

## 2013-02-25 DIAGNOSIS — R042 Hemoptysis: Secondary | ICD-10-CM

## 2013-02-25 DIAGNOSIS — I2699 Other pulmonary embolism without acute cor pulmonale: Principal | ICD-10-CM | POA: Diagnosis present

## 2013-02-25 DIAGNOSIS — R0602 Shortness of breath: Secondary | ICD-10-CM

## 2013-02-25 DIAGNOSIS — R911 Solitary pulmonary nodule: Secondary | ICD-10-CM

## 2013-02-25 DIAGNOSIS — Z7901 Long term (current) use of anticoagulants: Secondary | ICD-10-CM | POA: Insufficient documentation

## 2013-02-25 DIAGNOSIS — I82401 Acute embolism and thrombosis of unspecified deep veins of right lower extremity: Secondary | ICD-10-CM

## 2013-02-25 HISTORY — DX: Pneumonia, unspecified organism: J18.9

## 2013-02-25 LAB — CBC WITH DIFFERENTIAL/PLATELET
Eosinophils Relative: 5 % (ref 0–5)
Lymphocytes Relative: 19 % (ref 12–46)
Lymphs Abs: 2.1 10*3/uL (ref 0.7–4.0)
MCV: 91.1 fL (ref 78.0–100.0)
Monocytes Relative: 17 % — ABNORMAL HIGH (ref 3–12)
Platelets: 275 10*3/uL (ref 150–400)
RBC: 4.81 MIL/uL (ref 4.22–5.81)
WBC: 10.9 10*3/uL — ABNORMAL HIGH (ref 4.0–10.5)

## 2013-02-25 LAB — PROTIME-INR
INR: 1.1 (ref 0.00–1.49)
Prothrombin Time: 14 seconds (ref 11.6–15.2)

## 2013-02-25 LAB — BASIC METABOLIC PANEL
CO2: 22 mEq/L (ref 19–32)
Chloride: 99 mEq/L (ref 96–112)
Creatinine, Ser: 0.69 mg/dL (ref 0.50–1.35)
GFR calc Af Amer: >90 mL/min (ref >90)
Potassium: 3.6 mEq/L (ref 3.5–5.1)
Sodium: 135 mEq/L (ref 135–145)

## 2013-02-25 MED ORDER — SODIUM CHLORIDE 0.9 % IJ SOLN: 3.0000 mL | INTRAMUSCULAR | Status: DC | PRN

## 2013-02-25 MED ORDER — WARFARIN - PHYSICIAN DOSING INPATIENT: Freq: Every day | Status: DC

## 2013-02-25 MED ORDER — WARFARIN SODIUM 5 MG PO TABS
5.0000 mg | ORAL_TABLET | Freq: Once | ORAL | Status: DC
  Administered 2013-02-25: 5 mg via ORAL
  Filled 2013-02-25: qty 1

## 2013-02-25 MED ORDER — ENOXAPARIN SODIUM 100 MG/ML ~~LOC~~ SOLN
1.0000 mg/kg | Freq: Once | SUBCUTANEOUS | Status: AC
  Administered 2013-02-25: 100 mg via SUBCUTANEOUS
  Filled 2013-02-25: qty 1

## 2013-02-25 MED ORDER — MORPHINE SULFATE 2 MG/ML IJ SOLN: 1.0000 mg | INTRAMUSCULAR | Status: DC | PRN

## 2013-02-25 MED ORDER — ALUM & MAG HYDROXIDE-SIMETH 200-200-20 MG/5ML PO SUSP: 30.0000 mL | Freq: Four times a day (QID) | ORAL | Status: DC | PRN

## 2013-02-25 MED ORDER — SODIUM CHLORIDE 0.9 % IV SOLN
250.0000 mL | INTRAVENOUS | Status: DC | PRN
Start: 2013-02-25 — End: 2013-02-26

## 2013-02-25 MED ORDER — RIVAROXABAN 15 MG PO TABS
15.0000 mg | ORAL_TABLET | Freq: Two times a day (BID) | ORAL | Status: DC
  Administered 2013-02-25 – 2013-02-26 (×3): 15 mg via ORAL
  Filled 2013-02-25 (×3): qty 1

## 2013-02-25 MED ORDER — ACETAMINOPHEN 650 MG RE SUPP: 650.0000 mg | Freq: Four times a day (QID) | RECTAL | Status: DC | PRN

## 2013-02-25 MED ORDER — SODIUM CHLORIDE 0.9 % IJ SOLN
3.0000 mL | Freq: Two times a day (BID) | INTRAMUSCULAR | Status: DC
  Administered 2013-02-25 – 2013-02-26 (×2): 3 mL via INTRAVENOUS

## 2013-02-25 MED ORDER — ACETAMINOPHEN 325 MG PO TABS: 650.0000 mg | ORAL_TABLET | Freq: Four times a day (QID) | ORAL | Status: DC | PRN

## 2013-02-25 MED ORDER — RIVAROXABAN 20 MG PO TABS: 20.0000 mg | ORAL_TABLET | Freq: Every day | ORAL | Status: DC

## 2013-02-25 MED ORDER — MELOXICAM 7.5 MG PO TABS
15.0000 mg | ORAL_TABLET | Freq: Every day | ORAL | Status: DC
  Administered 2013-02-25 – 2013-02-26 (×2): 15 mg via ORAL
  Filled 2013-02-25: qty 2
  Filled 2013-02-25 (×2): qty 1
  Filled 2013-02-25: qty 2

## 2013-02-25 MED ORDER — ALBUTEROL SULFATE (5 MG/ML) 0.5% IN NEBU: 2.5000 mg | INHALATION_SOLUTION | RESPIRATORY_TRACT | Status: DC | PRN

## 2013-02-25 MED ORDER — ONDANSETRON HCL 4 MG PO TABS: 4.0000 mg | ORAL_TABLET | Freq: Four times a day (QID) | ORAL | Status: DC | PRN

## 2013-02-25 MED ORDER — SODIUM CHLORIDE 0.9 % IJ SOLN
3.0000 mL | Freq: Two times a day (BID) | INTRAMUSCULAR | Status: DC
  Administered 2013-02-25: 3 mL via INTRAVENOUS

## 2013-02-25 MED ORDER — OXYCODONE HCL 5 MG PO TABS: 5.0000 mg | ORAL_TABLET | ORAL | Status: DC | PRN

## 2013-02-25 MED ORDER — ONDANSETRON HCL 4 MG/2ML IJ SOLN: 4.0000 mg | Freq: Four times a day (QID) | INTRAMUSCULAR | Status: DC | PRN

## 2013-02-25 MED ORDER — IOHEXOL 300 MG/ML  SOLN
80.0000 mL | Freq: Once | INTRAMUSCULAR | Status: AC | PRN
  Administered 2013-02-25: 80 mL via INTRAVENOUS

## 2013-02-25 NOTE — ED Notes (Signed)
Attempted report nurse states will call right back.

## 2013-02-25 NOTE — ED Notes (Signed)
Meal tray given 

## 2013-02-25 NOTE — ED Notes (Addendum)
Pt c/o prod cough with clear sputum and sob x 1 month. Seen pcp and dx with bilateral PNA yesterday. pcp wanted ct scan today and showed PE today. Pt denies any pain at this time. Has been tired lately. Alert/oriented. Color wnl. No resp distress noted. Mild accessory muscle use noted. Able to finish sentences. Nad. Pt states did start to have a cramping to lower left chest/luq area pta. Pt also states had sudden right lower back pain with increased sob Saturday. Pt states has coughed up some blood over the past 2 days.

## 2013-02-25 NOTE — ED Provider Notes (Signed)
CSN: [id number]     Arrival date & time 02/25/13  1219 History   First MD Initiated Contact with Patient 02/25/13 1255     Chief Complaint  Patient presents with  . Shortness of Breath   (Consider location/radiation/quality/duration/timing/severity/associated sxs/prior Treatment) Patient is a 52 y.o. male presenting with shortness of breath.  Shortness of Breath  52 year old male who presents today with reports that he has a pulmonary embolism. Saturday evening he began having a sudden onset of right mid back pain and pain with deep inspiration and dyspnea. He was seen in his primary care office and cancel [place] yesterday and had an outpatient CT of the chest ordered. That was performed today and the patient was noted to have a right lower lobe filling defect consistent with pulmonary embolism. He was also noted yesterday have a low-grade fever and was thought to have pneumonia. He states he has had some cough productive of pale sputum. He denies any pain or swelling in his extremities or history of DVT. He was recently on a long car trip that was about 9 hours to get his son for the Thanksgiving holidays. He has no other DVT risk factors. He has pain on the right side of his chest with deep inspiration. He has some coughing. He denies lightheadedness or generalized weakness and is not currently dyspneic. Past Medical History  Diagnosis Date  . PNA (pneumonia)    Past Surgical History  Procedure Laterality Date  . Incision and drainage of wound  07/06/2011    Procedure: IRRIGATION AND DEBRIDEMENT WOUND;  Surgeon: Tami Ribas, MD;  Location: [facility] OR;  Service: Orthopedics;  Laterality: Left;  . Knee surgery Right   . Hand surgery Left    History reviewed. No pertinent family history. History  Substance Use Topics  . Smoking status: Former Games developer  . Smokeless tobacco: Not on file  . Alcohol Use: No    Review of Systems  Respiratory: Positive for shortness of breath.   All other systems  reviewed and are negative.    Allergies  Review of patient's allergies indicates no known allergies.  Home Medications   Current Outpatient Rx  Name  Route  Sig  Dispense  Refill  . aspirin 325 MG tablet   Oral   Take 325 mg by mouth daily.          BP 145/103  Pulse 71  Temp(Src) 99.6 F (37.6 C) (Oral)  Resp 20  SpO2 96% Physical Exam  Nursing note and vitals reviewed. Constitutional: He is oriented to person, place, and time. He appears well-developed and well-nourished.  HENT:  Head: Normocephalic and atraumatic.  Right Ear: External ear normal.  Left Ear: External ear normal.  Nose: Nose normal.  Mouth/Throat: Oropharynx is clear and moist.  Eyes: Conjunctivae and EOM are normal. Pupils are equal, round, and reactive to [place].  Neck: Normal range of motion. Neck supple.  Cardiovascular: Normal rate, regular rhythm, normal heart sounds and intact distal pulses.   Pulmonary/Chest: Effort normal and breath sounds normal. No respiratory distress. He has no wheezes. He exhibits no tenderness.  Abdominal: Soft. Bowel sounds are normal. He exhibits no distension and no mass. There is no tenderness. There is no guarding.  Musculoskeletal: Normal range of motion.  Neurological: He is alert and oriented to person, place, and time. He has normal reflexes. He exhibits normal muscle tone. Coordination normal.  Skin: Skin is warm and dry.  Psychiatric: He has a normal mood and affect.  His behavior is normal. Judgment and thought content normal.    ED Course  Procedures (including critical care time) Labs Review Labs Reviewed  CBC WITH DIFFERENTIAL  BASIC METABOLIC PANEL   Imaging Review Ct Chest W Contrast  02/25/2013   CLINICAL DATA:  Shortness of breath, hemoptysis, solitary pulmonary nodule  EXAM: CT CHEST WITH CONTRAST  TECHNIQUE: Multidetector CT imaging of the chest was performed during intravenous contrast administration. Sagittal and coronal MPR images  reconstructed from axial data set.  CONTRAST:  80mL OMNIPAQUE IOHEXOL 300 MG/ML  SOLN  COMPARISON:  None  Correlation:  Chest radiograph 02/24/2013  FINDINGS: Aorta normal caliber, grossly normal appearance.  Despite exam not being performed with CTA technique, a filling defect is identified in the right lower lobe pulmonary artery consistent with pulmonary embolism.  RV:LV ratio of 0.89 (42.59mm/48.3mm)  Normal size mediastinal and axillary lymph nodes identified.  Questionable tiny cyst at upper pole of left kidney 11 mm diameter image 57, incompletely imaged.  Remaining visualized portions of upper abdomen unremarkable.  Tiny right pleural effusion.  Right basilar atelectasis.  Minimal atelectasis at left base.  No pneumothorax or acute osseous findings.  IMPRESSION: Right lower lobe pulmonary embolus.  Right basilar atelectasis and small right pleural effusion.  Critical Value/emergent results were called by telephone at the time of interpretation on 02/25/2013 at 1043 hr to [facility] PA, who verbally acknowledged these results.   Electronically Signed   By: Ulyses Southward M.D.   On: 02/25/2013 10:45    EKG Interpretation   None       MDM  Patient presents with pleuritic chest pain and CT consistent with pulmonary embolism. Lovenox and Coumadin are given here in the emergency department. I spoke with Dr.Ghimire and he will be placed on observation status tonight    Hilario Quarry, MD 02/25/13 [phone]

## 2013-02-25 NOTE — H&P (Signed)
PATIENT DETAILS Name: [name] Age: 52 y.o. Sex: male Date of Birth: 07-Feb-1961 Admit Date: 02/25/2013 WUJ:[id number], Lilyan Punt, FNP   CHIEF COMPLAINT:  Right mid back pain, cough since last Saturday  HPI: [name] is a 52 y.o. male with no significant Past Medical History  who presents today with the above noted complaint. Per patient, approximately 3-4 days ago he started developing pain in his right upper mid back area, pain is described as pleuritic, 5-6/10 at its worse without any radiation. Apart from deep breaths there no other aggravating factors, no particular relieving factors. No associated nausea, vomiting or diarrhea. There has been some associated low-grade fever. Patient also claims to have some associated cough with mostly clear sputum. He also claims to have mild shortness of breath it is exclusively on heavy exertion, but does not have shortness of breath with daily activities and at rest. He does claim that yesterday, he had 1 or 2 episodes of very mild hemoptysis with streaking of his sputum. He subsequently was thought to have pneumonia by his PCP and started on Levaquin, and sent for a CT scan of his chest which subsequently showed embolism, and the patient was sent to the emergency room, I was subsequently asked to this patient for further evaluation and treatment. Patient claims that over the Thanksgiving weekend, he made a 9 hour trip to [place] in 1 day. He has no other travel history, is not on any offending medications, has not had age appropriate malignancy screening as of yet.  ALLERGIES:  No Known Allergies  PAST MEDICAL HISTORY: Past Medical History  Diagnosis Date  . PNA (pneumonia)     PAST SURGICAL HISTORY: Past Surgical History  Procedure Laterality Date  . Incision and drainage of wound  07/06/2011    Procedure: IRRIGATION AND DEBRIDEMENT WOUND;  Surgeon: Tami Ribas, MD;  Location: [facility] OR;  Service: Orthopedics;  Laterality: Left;  .  Knee surgery Right   . Hand surgery Left     MEDICATIONS AT HOME: Prior to Admission medications   Medication Sig Start Date End Date Taking? Authorizing Provider  albuterol (PROVENTIL HFA;VENTOLIN HFA) 108 (90 BASE) MCG/ACT inhaler Inhale 2 puffs into the lungs every 6 (six) hours as needed for wheezing or shortness of breath.   Yes Historical Provider, MD  levofloxacin (LEVAQUIN) 750 MG tablet Take 750 mg by mouth daily.   Yes Historical Provider, MD  aspirin 325 MG tablet Take 325 mg by mouth daily.    Historical Provider, MD    FAMILY HISTORY: No family history of CAD or BTE.  SOCIAL HISTORY:  reports that he has quit smoking. He does not have any smokeless tobacco history on file. He reports that he does not drink alcohol or use illicit drugs.  REVIEW OF SYSTEMS:  Constitutional:   No  weight loss, night sweats,  Fevers, chills, fatigue.  HEENT:    No headaches, Difficulty swallowing,Tooth/dental problems,Sore throat,  No sneezing, itching, ear ache, nasal congestion, post nasal drip,   Cardio-vascular: No chest pain,  Orthopnea, PND, swelling in lower extremities, anasarca,   dizziness, palpitations  GI:  No heartburn, indigestion, abdominal pain, nausea, vomiting, diarrhea, change in bowel habits, loss of appetite  Resp: No shortness of breath with exertion or at rest.  No excess mucus, no productive cough, No non-productive cough,  No coughing up of blood.No change in color of mucus.No wheezing.No chest wall deformity  Skin:  no rash or lesions.  GU:  no dysuria, change  in color of urine, no urgency or frequency.  No flank pain.  Musculoskeletal: No joint pain or swelling.  No decreased range of motion.  No back pain.  Psych: No change in mood or affect. No depression or anxiety.  No memory loss.   PHYSICAL EXAM: Blood pressure 141/80, pulse 75, temperature 99.6 F (37.6 C), temperature source Oral, resp. rate 17, height 6\' 2"  (1.88 m), weight 100.245 kg (221  lb), SpO2 95.00%.  General appearance :Awake, alert, not in any distress. Speech Clear. Not toxic Looking HEENT: Atraumatic and Normocephalic, pupils equally reactive to [place] and accomodation Neck: supple, no JVD. No cervical lymphadenopathy.  Chest:Good air entry bilaterally, no added sounds  CVS: S1 S2 regular, no murmurs.  Abdomen: Bowel sounds present, Non tender and not distended with no gaurding, rigidity or rebound. Extremities: B/L Lower Ext shows no edema, both legs are warm to touch Neurology: Awake alert, and oriented X 3, CN II-XII intact, Non focal Skin:No Rash Wounds:N/A  LABS ON ADMISSION:   Recent Labs  02/25/13 1240  NA 135  K 3.6  CL 99  CO2 22  GLUCOSE 98  BUN 13  CREATININE 0.69  CALCIUM 9.2   No results found for this basename: AST, ALT, ALKPHOS, BILITOT, PROT, ALBUMIN,  in the last 72 hours No results found for this basename: LIPASE, AMYLASE,  in the last 72 hours  Recent Labs  02/25/13 1240  WBC 10.9*  NEUTROABS PENDING  HGB 14.9  HCT 43.8  MCV 91.1  PLT 275   No results found for this basename: CKTOTAL, CKMB, CKMBINDEX, TROPONINI,  in the last 72 hours No results found for this basename: DDIMER,  in the last 72 hours No components found with this basename: POCBNP,    RADIOLOGIC STUDIES ON ADMISSION: Ct Chest W Contrast  02/25/2013   CLINICAL DATA:  Shortness of breath, hemoptysis, solitary pulmonary nodule  EXAM: CT CHEST WITH CONTRAST  TECHNIQUE: Multidetector CT imaging of the chest was performed during intravenous contrast administration. Sagittal and coronal MPR images reconstructed from axial data set.  CONTRAST:  80mL OMNIPAQUE IOHEXOL 300 MG/ML  SOLN  COMPARISON:  None  Correlation:  Chest radiograph 02/24/2013  FINDINGS: Aorta normal caliber, grossly normal appearance.  Despite exam not being performed with CTA technique, a filling defect is identified in the right lower lobe pulmonary artery consistent with pulmonary embolism.  RV:LV  ratio of 0.89 (42.45mm/48.3mm)  Normal size mediastinal and axillary lymph nodes identified.  Questionable tiny cyst at upper pole of left kidney 11 mm diameter image 57, incompletely imaged.  Remaining visualized portions of upper abdomen unremarkable.  Tiny right pleural effusion.  Right basilar atelectasis.  Minimal atelectasis at left base.  No pneumothorax or acute osseous findings.  IMPRESSION: Right lower lobe pulmonary embolus.  Right basilar atelectasis and small right pleural effusion.  Critical Value/emergent results were called by telephone at the time of interpretation on 02/25/2013 at 1043 hr to [facility] PA, who verbally acknowledged these results.   Electronically Signed   By: Ulyses Southward M.D.   On: 02/25/2013 10:45     EKG: Independently reviewed. Normal sinus rhythm  ASSESSMENT AND PLAN: Present on Admission:  . Pulmonary embolism - Patient presents with acute on embolism, this may have been been provoked by his recent 9 Hour drive to [place]. In any event, patient will be admitted, started on anticoagulation (see below discussion was done), 2-D echocardiogram and lower extremity venous Doppler will be ordered. Apart from right  upper back pleuritic pain, patient does not have any symptoms. He has no evidence of hypoxia, he is not tachycardic or hypotensive. Suspect, patient could be observed overnight, and potentially discharged tomorrow. - I had a long discussion with the patient, regarding anticoagulation and different agents that are now available. We discussed in detail, Coumadin versus new novel anticoagulants. Pros and cons of both the agents were discussed in detail, at this time patient would like to be placed on new novel agents, hence will place him on a Xarelto. Will have case management, evaluate him tomorrow to make sure that his insurance will cover Xarelto. - I also advised him, to have his primary care practitioner refer him to gastroenterology for screening  colonoscopy, and other age-appropriate cancer screening.  Further plan will depend as patient's clinical course evolves and further radiologic and laboratory data become available. Patient will be monitored closely.  Above noted plan was discussed with patient, he was in agreement.   DVT Prophylaxis: Not needed as the patient will be on Xarelto  Code Status: Full Code  Total time spent for admission equals 45 minutes.  [facility] Pager [phone]  If 7PM-7AM, please contact night-coverage [email or web address] Password [email or web address] 02/25/2013, 2:46 PM

## 2013-02-25 NOTE — Progress Notes (Signed)
ANTICOAGULATION CONSULT NOTE - Initial Consult  Pharmacy Consult for Xarelto Indication: pulmonary embolus  No Known Allergies  Patient Measurements: Height: 6\' 2"  (188 cm) Weight: 221 lb (100.245 kg) IBW/kg (Calculated) : 82.2  Vital Signs: Temp: 99 F (37.2 C) (12/09 1515) Temp src: Oral (12/09 1225) BP: 137/97 mmHg (12/09 1515) Pulse Rate: 75 (12/09 1408)  Labs:  Recent Labs  02/25/13 1240  HGB 14.9  HCT 43.8  PLT 275  LABPROT 14.0  INR 1.10  CREATININE 0.69    Estimated Creatinine Clearance: 138.1 ml/min (by C-G formula based on Cr of 0.69).   Medical History: Past Medical History  Diagnosis Date  . PNA (pneumonia)     Medications:  Scheduled:  . meloxicam  15 mg Oral Daily  . rivaroxaban  15 mg Oral BID WC   Followed by  . [START ON 03/18/2013] rivaroxaban  20 mg Oral Q supper  . sodium chloride  3 mL Intravenous Q12H  . sodium chloride  3 mL Intravenous Q12H    Assessment: 52 yo M admitted with +PE.  Received dose of Lovenox/Coumadin in Ed, but changing to Xarelto.   CBC reviewed.  No bleeding noted.   Renal function at patient's baseline.   Goal of Therapy:  Monitor platelets by anticoagulation protocol: Yes   Plan:  Xarelto 15mg  po bid x 3 weeks then 20mg  po daily Monitor CBC Initiate patient education  Elson Clan 02/25/2013,3:45 PM

## 2013-02-26 DIAGNOSIS — I517 Cardiomegaly: Secondary | ICD-10-CM

## 2013-02-26 DIAGNOSIS — I82409 Acute embolism and thrombosis of unspecified deep veins of unspecified lower extremity: Secondary | ICD-10-CM

## 2013-02-26 DIAGNOSIS — I82401 Acute embolism and thrombosis of unspecified deep veins of right lower extremity: Secondary | ICD-10-CM

## 2013-02-26 LAB — CBC
HCT: 43.4 % (ref 39.0–52.0)
Hemoglobin: 14.6 g/dL (ref 13.0–17.0)
MCHC: 33.6 g/dL (ref 30.0–36.0)
Platelets: 305 10*3/uL (ref 150–400)
RDW: 13 % (ref 11.5–15.5)
WBC: 9.6 10*3/uL (ref 4.0–10.5)

## 2013-02-26 LAB — BASIC METABOLIC PANEL
BUN: 14 mg/dL (ref 6–23)
Chloride: 103 mEq/L (ref 96–112)
GFR calc Af Amer: >90 mL/min (ref >90)
GFR calc non Af Amer: >90 mL/min (ref >90)
Glucose, Bld: 107 mg/dL — ABNORMAL HIGH (ref 70–99)
Potassium: 3.6 mEq/L (ref 3.5–5.1)
Sodium: 141 mEq/L (ref 135–145)

## 2013-02-26 MED ORDER — RIVAROXABAN 20 MG PO TABS: 20.0000 mg | ORAL_TABLET | Freq: Every day | ORAL | Status: AC

## 2013-02-26 MED ORDER — RIVAROXABAN 15 MG PO TABS: 15.0000 mg | ORAL_TABLET | Freq: Two times a day (BID) | ORAL | Status: DC

## 2013-02-26 NOTE — Discharge Summary (Signed)
Physician Discharge Summary  [name]:[id number] DOB: 1960-04-27 DOA: 02/25/2013  PCP: [name], FNP  Admit date: 02/25/2013 Discharge date: 02/26/2013  Recommendations for Outpatient Follow-up:  1. Pt will need to follow up with PCP in 2 weeks post discharge 2. Please obtain BMP to evaluate electrolytes and kidney function 3. Please also check CBC to evaluate Hg and Hct levels   Discharge Diagnoses:  Principal Problem:   Pulmonary embolism  pulmonary embolism -May have been provoked by the patient's recent 9 Hour to [place] -No other risk factors -Will need colon cancer screening in the outpatient setting -Echocardiogram shows no RV strain, EF normal -Venous duplex lower extremities positive for RLE popliteal DVT -Continue rivaroxaban 15 mg twice a day x20 additional days then 20 mg once daily Right popliteal DVT -continue xarelto -Stop after a while the patient is on rivaroxaban Discharge Condition: Stable  Disposition:  discharge home  Diet: Regular Wt Readings from Last 3 Encounters:  02/25/13 100.245 kg (221 lb)    History of present illness:  [name] is a 52 y.o. male with no significant Past Medical History who presents today with the above noted complaint. Per patient, approximately 3-4 days ago he started developing pain in his right upper mid back area, pain is described as pleuritic, 5-6/10 at its worse without any radiation. Apart from deep breaths there no other aggravating factors, no particular relieving factors. No associated nausea, vomiting or diarrhea. There has been some associated low-grade fever. Patient also claims to have some associated cough with mostly clear sputum. He also claims to have mild shortness of breath it is exclusively on heavy exertion, but does not have shortness of breath with daily activities and at rest. He does claim that yesterday, he had 1 or 2 episodes of very mild hemoptysis with streaking of his sputum. He  subsequently was thought to have pneumonia by his PCP and started on Levaquin, and sent for a CT scan of his chest which subsequently showed embolism, and the patient was sent to the emergency room, I was subsequently asked to this patient for further evaluation and treatment.  Patient claims that over the Thanksgiving weekend, he made a 9 hour trip to [place] in 1 day. He has no other travel history, is not on any offending medications, has not had age appropriate malignancy screening as of yet. The patient was started on rivaroxaban after discussion of the risks, benefits, and alternatives. He understood and agreed to follow treatment protocol. The patient remained hemodynamically stable without tachycardia. There was no hypoxemia. Echocardiogram did not reveal any RV strain. The patient was discharged in stable condition. He was instructed to follow up with his primary care provider to determine his ultimate duration of anticoagulation and future workup for hypercoagulable state.     Discharge Exam: Filed Vitals:   02/26/13 1428  BP: 133/84  Pulse: 74  Temp: 98.1 F (36.7 C)  Resp: 18   Filed Vitals:   02/25/13 1408 02/25/13 1515 02/25/13 2300 02/26/13 1428  BP: 141/80 137/97 126/87 133/84  Pulse: 75  70 74  Temp:  99 F (37.2 C) 98.7 F (37.1 C) 98.1 F (36.7 C)  TempSrc:   Oral Oral  Resp: 17 20 16 18   Height:      Weight:      SpO2: 95% 98% 96% 93%   General: A&O x 3, NAD, pleasant, cooperative Cardiovascular: RRR, no rub, no gallop, no S3 Respiratory: CTAB, no wheeze, no rhonchi Abdomen:soft, nontender,  nondistended, positive bowel sounds Extremities: trace LE edema, No lymphangitis, no petechiae  Discharge Instructions  Discharge Orders   Future Orders Complete By Expires   Diet - low sodium heart healthy  As directed    Increase activity slowly  As directed        Medication List    STOP taking these medications       aspirin 325 MG tablet     levofloxacin  750 MG tablet  Commonly known as:  LEVAQUIN      TAKE these medications       albuterol 108 (90 BASE) MCG/ACT inhaler  Commonly known as:  PROVENTIL HFA;VENTOLIN HFA  Inhale 2 puffs into the lungs every 6 (six) hours as needed for wheezing or shortness of breath.     Rivaroxaban 15 MG Tabs tablet  Commonly known as:  XARELTO  Take 1 tablet (15 mg total) by mouth 2 (two) times daily with a meal.     Rivaroxaban 20 MG Tabs tablet  Commonly known as:  XARELTO  Take 1 tablet (20 mg total) by mouth daily with supper. Start on 03/19/13 after finished with 15mg  dose  Start taking on:  03/18/2013         The results of significant diagnostics from this hospitalization (including imaging, microbiology, ancillary and laboratory) are listed below for reference.    Significant Diagnostic Studies: Ct Chest W Contrast  02/25/2013   CLINICAL DATA:  Shortness of breath, hemoptysis, solitary pulmonary nodule  EXAM: CT CHEST WITH CONTRAST  TECHNIQUE: Multidetector CT imaging of the chest was performed during intravenous contrast administration. Sagittal and coronal MPR images reconstructed from axial data set.  CONTRAST:  80mL OMNIPAQUE IOHEXOL 300 MG/ML  SOLN  COMPARISON:  None  Correlation:  Chest radiograph 02/24/2013  FINDINGS: Aorta normal caliber, grossly normal appearance.  Despite exam not being performed with CTA technique, a filling defect is identified in the right lower lobe pulmonary artery consistent with pulmonary embolism.  RV:LV ratio of 0.89 (42.52mm/48.3mm)  Normal size mediastinal and axillary lymph nodes identified.  Questionable tiny cyst at upper pole of left kidney 11 mm diameter image 57, incompletely imaged.  Remaining visualized portions of upper abdomen unremarkable.  Tiny right pleural effusion.  Right basilar atelectasis.  Minimal atelectasis at left base.  No pneumothorax or acute osseous findings.  IMPRESSION: Right lower lobe pulmonary embolus.  Right basilar atelectasis and  small right pleural effusion.  Critical Value/emergent results were called by telephone at the time of interpretation on 02/25/2013 at 1043 hr to [facility] PA, who verbally acknowledged these results.   Electronically Signed   By: Ulyses Southward M.D.   On: 02/25/2013 10:45   US Venous Img Lower Bilateral  02/25/2013   CLINICAL DATA:  Acute pulmonary embolus, evaluate for DVT  EXAM: Bilateral LOWER EXTREMITY VENOUS DOPPLER ULTRASOUND  TECHNIQUE: Gray-scale sonography with graded compression, as well as color Doppler and duplex ultrasound, were performed to evaluate the deep venous system from the level of the common femoral vein through the popliteal and proximal calf veins. Spectral Doppler was utilized to evaluate flow at rest and with distal augmentation maneuvers.  COMPARISON:  CT PE study 02/25/2013  FINDINGS: Thrombus within deep veins: Echogenic filling defect noted within the right popliteal vein. This is of relatively short length and is incompletely occlusive. The remainder of the right lower extremity veins are free from thrombus. No evidence of filling defect or thrombus throughout the left lower extremity.  Compressibility of  deep veins: Focally noncompressible right popliteal vein. Otherwise, normal compressibility.  Duplex waveform respiratory phasicity:  Normal.  Duplex waveform response to augmentation:  Normal.  Venous reflux:  None visualized.  Other findings:  None visualized.  IMPRESSION: Positive for small volume acute DVT in the right popliteal vein.  These results were called by telephone at the time of interpretation on 02/25/2013 at 4:59 PM to Dr. Jeoffrey Massed , who verbally acknowledged these results.   Electronically Signed   By: Malachy Moan M.D.   On: 02/25/2013 16:59     Microbiology: No results found for this or any previous visit (from the past 240 hour(s)).   Labs: Basic Metabolic Panel:  Recent Labs Lab 02/25/13 1240 02/26/13 0504  NA 135 141  K 3.6 3.6   CL 99 103  CO2 22 27  GLUCOSE 98 107*  BUN 13 14  CREATININE 0.69 0.90  CALCIUM 9.2 9.3   Liver Function Tests: No results found for this basename: AST, ALT, ALKPHOS, BILITOT, PROT, ALBUMIN,  in the last 168 hours No results found for this basename: LIPASE, AMYLASE,  in the last 168 hours No results found for this basename: AMMONIA,  in the last 168 hours CBC:  Recent Labs Lab 02/25/13 1240 02/26/13 0504  WBC 10.9* 9.6  NEUTROABS 6.4  --   HGB 14.9 14.6  HCT 43.8 43.4  MCV 91.1 91.9  PLT 275 305   Cardiac Enzymes: No results found for this basename: CKTOTAL, CKMB, CKMBINDEX, TROPONINI,  in the last 168 hours BNP: No components found with this basename: POCBNP,  CBG: No results found for this basename: GLUCAP,  in the last 168 hours  Time coordinating discharge:  Greater than 30 minutes  Signed:  Aldyn Toon, DO [facility] Pager: [phone] 02/26/2013, 5:39 PM

## 2013-02-26 NOTE — Progress Notes (Signed)
UR chart review completed.  

## 2013-02-26 NOTE — Progress Notes (Signed)
*  PRELIMINARY RESULTS* Echocardiogram 2D Echocardiogram has been performed.  Wagner Tanzi 02/26/2013, 1:02 PM

## 2013-02-26 NOTE — Progress Notes (Signed)
Patient states understanding of discharge instructions.  

## 2013-04-23 IMAGING — CR DG HAND COMPLETE 3+V*L*
3 series · 3 of 3 positions shown · non-contrast
Comparison: None.

CLINICAL DATA: Pain and laceration after table saw injury

LEFT HAND - COMPLETE 3+ VIEW

[view not recorded (1 of 3)]
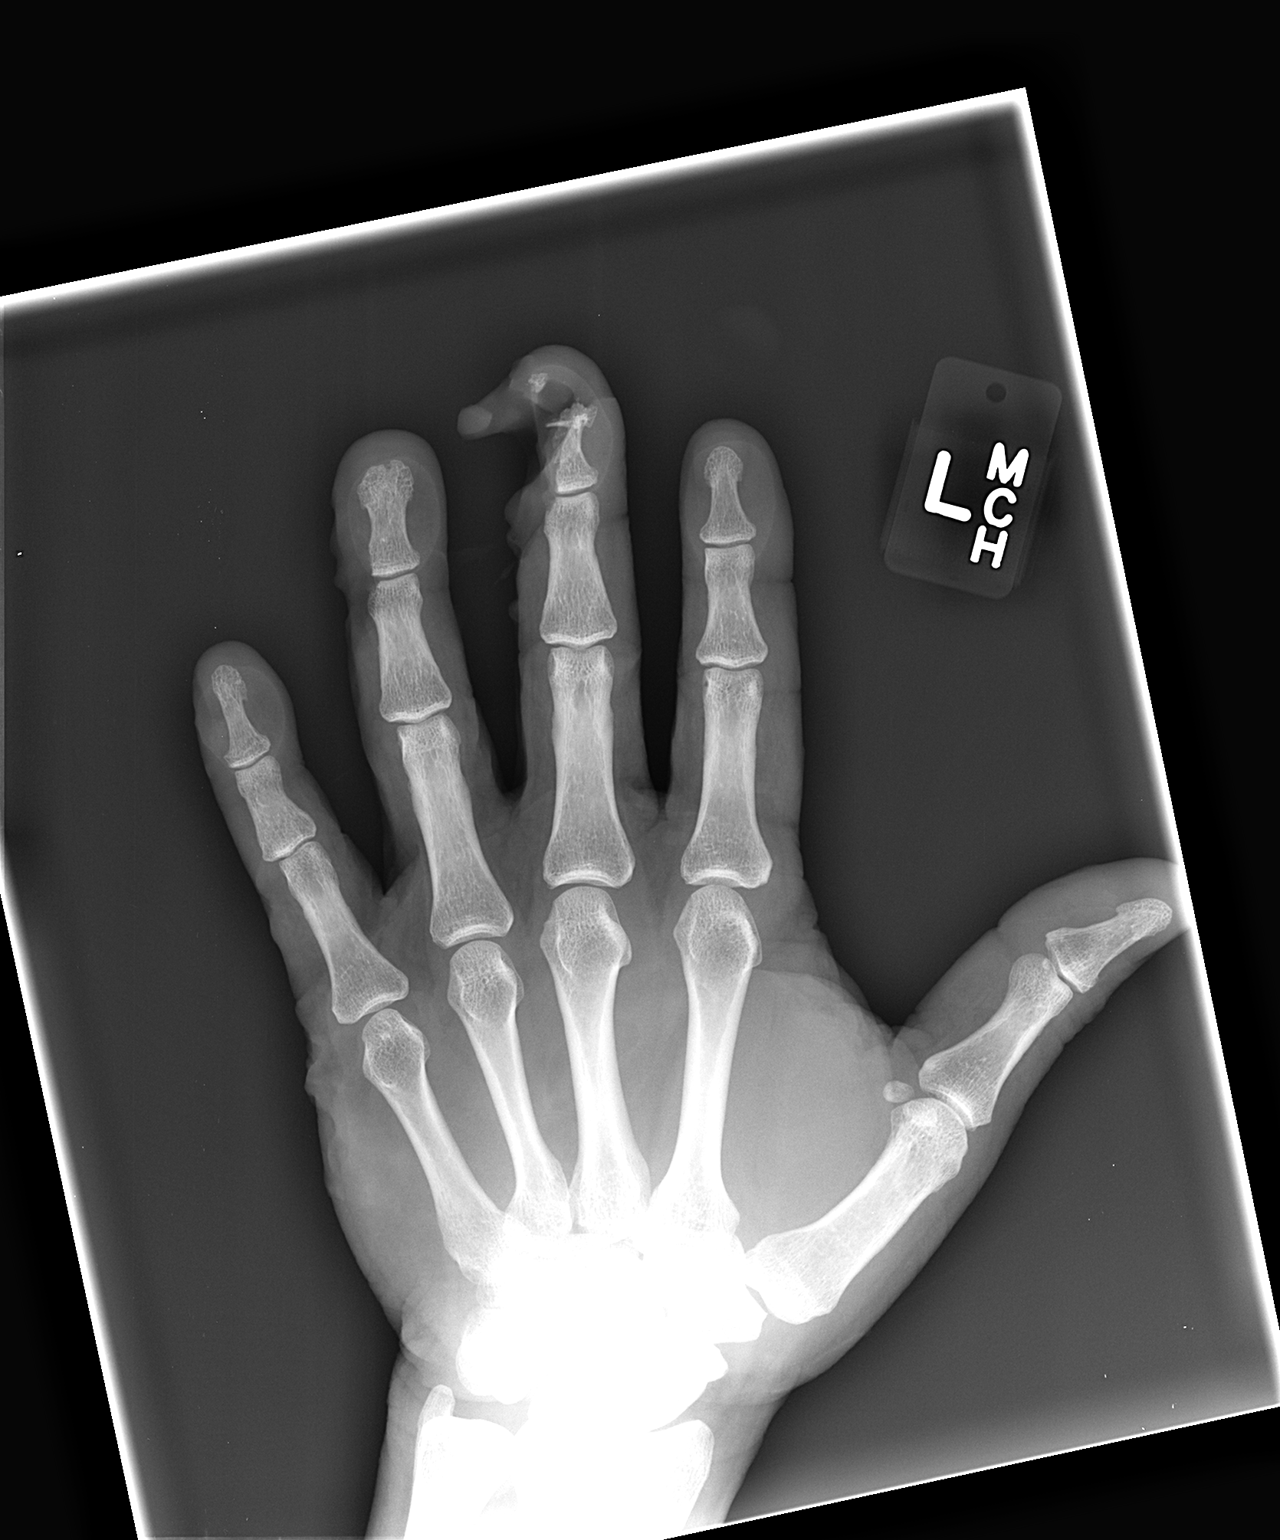

[view not recorded (2 of 3)]
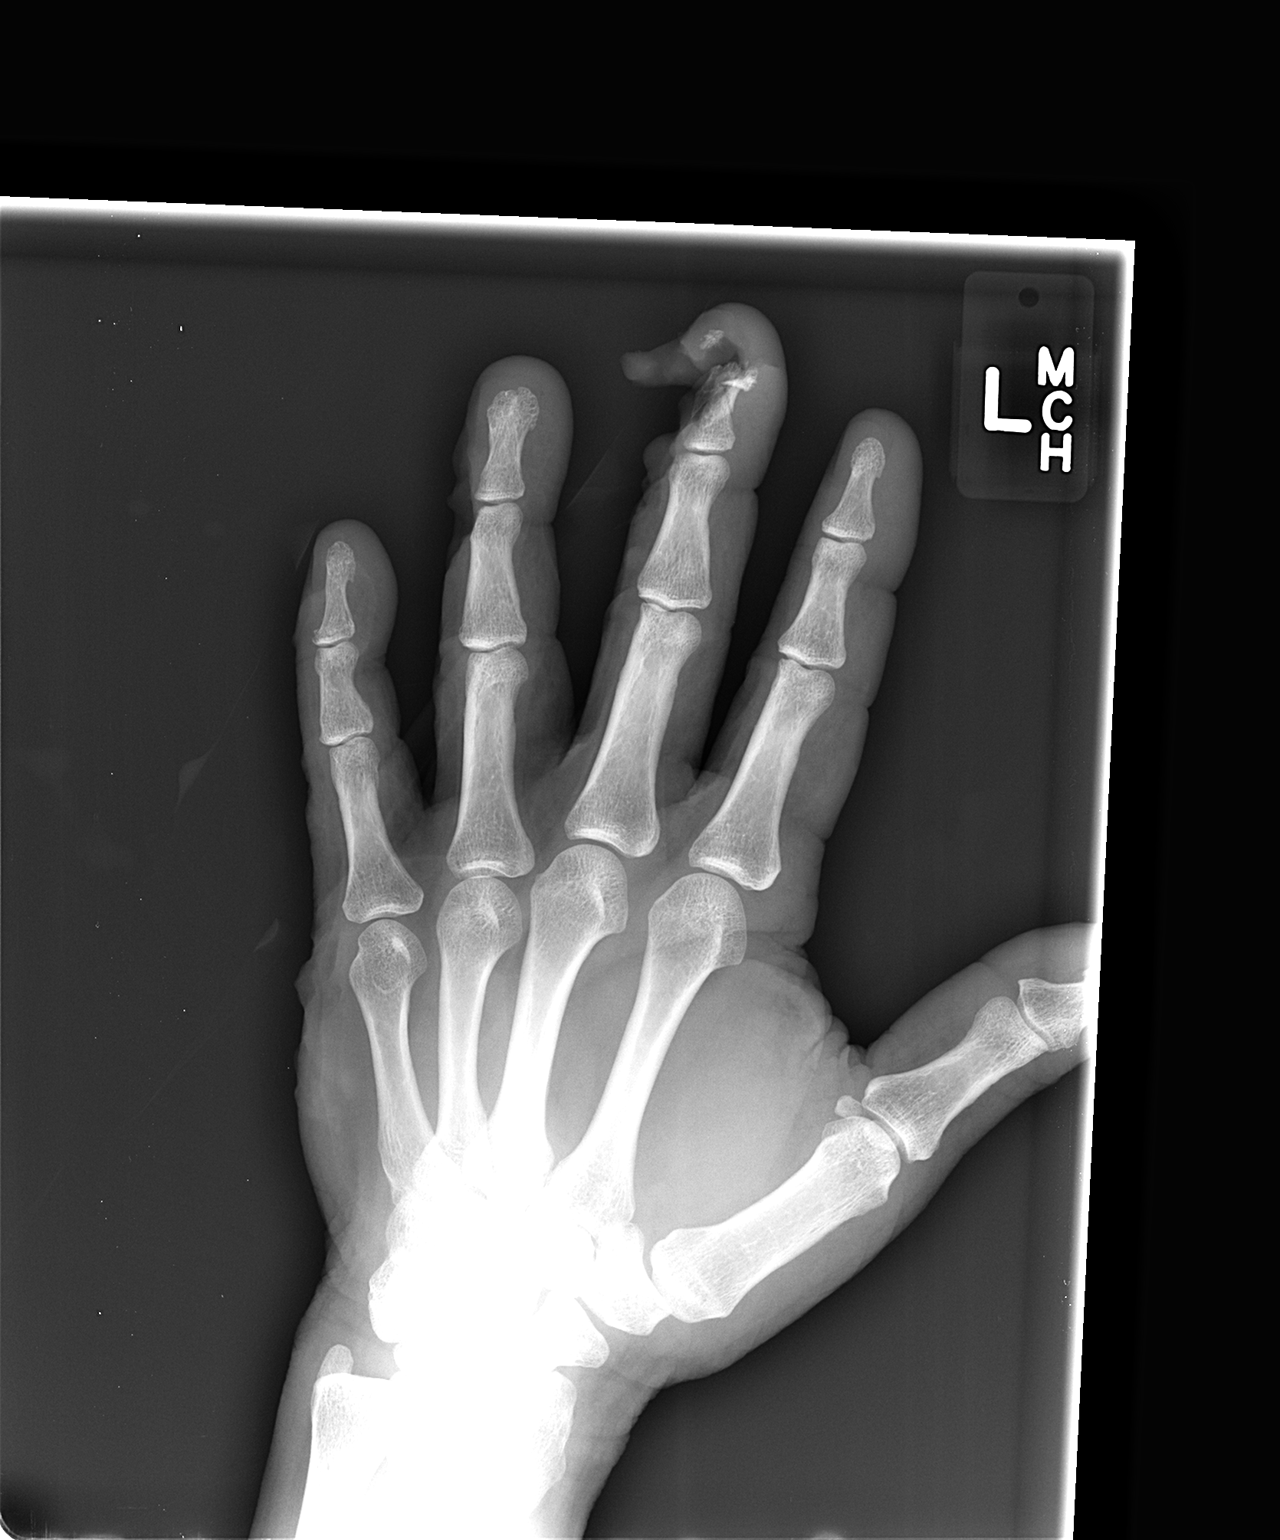

[view not recorded (3 of 3)]
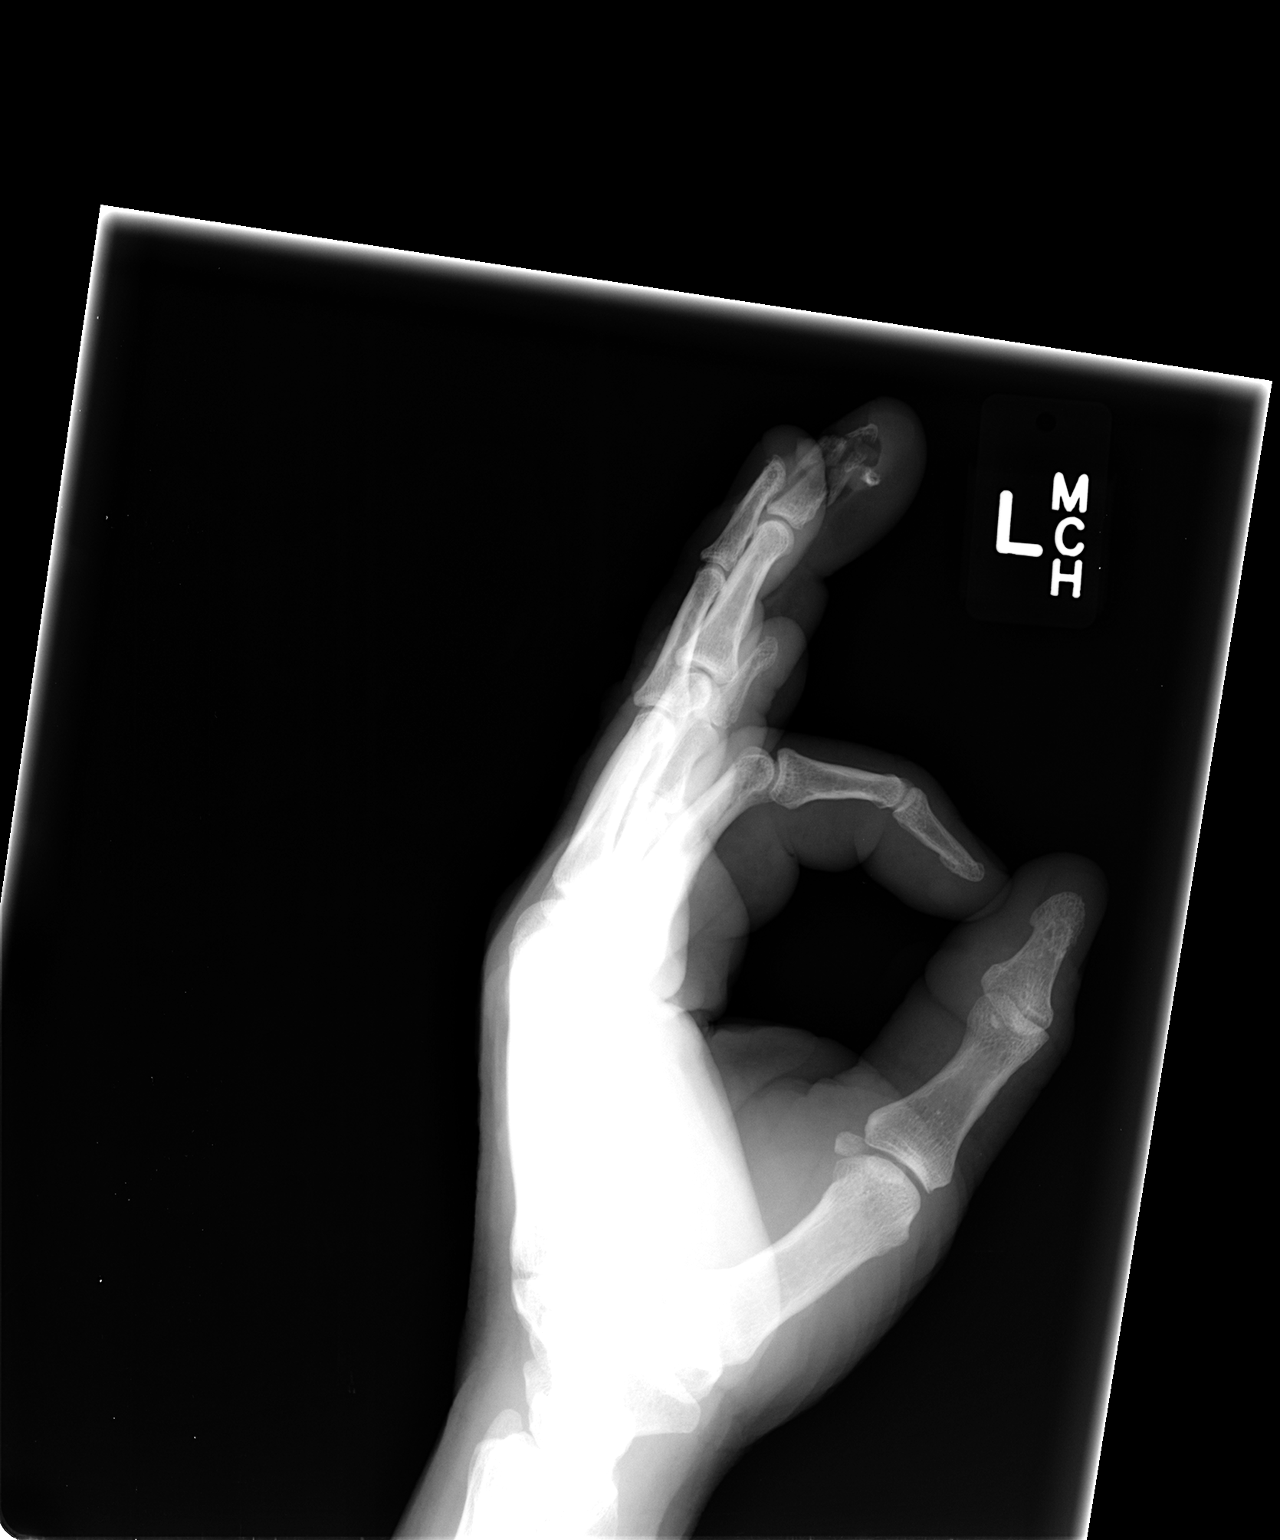

[3 of 3 positions shown; findings below may reference images not displayed]

FINDINGS: There is a comminuted fracture of the third distal
phalanx.  There are also chip fractures of the distal third middle
phalanx and the distal fourth middle phalanx and the proximal
fourth distal phalanx.  Overlying soft tissue irregularity is
present.
IMPRESSION: There are fractures involving the third and fourth distal and
middle phalanges.

## 2013-07-02 ENCOUNTER — Emergency Department (HOSPITAL_COMMUNITY)
Admission: EM | Admit: 2013-07-02 | Discharge: 2013-07-02 | Disposition: A | Discharge status: Home / Self Care | Payer: Medicaid Other | Attending: Emergency Medicine | Admitting: Emergency Medicine

## 2013-07-02 ENCOUNTER — Emergency Department (HOSPITAL_COMMUNITY): Payer: Medicaid Other

## 2013-07-02 ENCOUNTER — Encounter (HOSPITAL_COMMUNITY): Payer: Self-pay | Admitting: Emergency Medicine

## 2013-07-02 DIAGNOSIS — Z87891 Personal history of nicotine dependence: Secondary | ICD-10-CM | POA: Insufficient documentation

## 2013-07-02 DIAGNOSIS — X500XXA Overexertion from strenuous movement or load, initial encounter: Secondary | ICD-10-CM | POA: Insufficient documentation

## 2013-07-02 DIAGNOSIS — Z79899 Other long term (current) drug therapy: Secondary | ICD-10-CM | POA: Insufficient documentation

## 2013-07-02 DIAGNOSIS — Y9389 Activity, other specified: Secondary | ICD-10-CM | POA: Insufficient documentation

## 2013-07-02 DIAGNOSIS — Z7901 Long term (current) use of anticoagulants: Secondary | ICD-10-CM | POA: Insufficient documentation

## 2013-07-02 DIAGNOSIS — S82899A Other fracture of unspecified lower leg, initial encounter for closed fracture: Secondary | ICD-10-CM | POA: Insufficient documentation

## 2013-07-02 DIAGNOSIS — S82891A Other fracture of right lower leg, initial encounter for closed fracture: Secondary | ICD-10-CM

## 2013-07-02 DIAGNOSIS — Z8701 Personal history of pneumonia (recurrent): Secondary | ICD-10-CM | POA: Insufficient documentation

## 2013-07-02 DIAGNOSIS — Y929 Unspecified place or not applicable: Secondary | ICD-10-CM | POA: Insufficient documentation

## 2013-07-02 DIAGNOSIS — Z86711 Personal history of pulmonary embolism: Secondary | ICD-10-CM | POA: Insufficient documentation

## 2013-07-02 HISTORY — DX: Other pulmonary embolism without acute cor pulmonale: I26.99

## 2013-07-02 MED ORDER — HYDROCODONE-ACETAMINOPHEN 5-325 MG PO TABS
1.0000 | ORAL_TABLET | Freq: Once | ORAL | Status: AC
  Administered 2013-07-02: 1 via ORAL
  Filled 2013-07-02: qty 1

## 2013-07-02 MED ORDER — HYDROCODONE-ACETAMINOPHEN 5-325 MG PO TABS: ORAL_TABLET | ORAL | Status: DC

## 2013-07-02 NOTE — ED Notes (Signed)
Pt reports twisted r ankle coming down some steps today.  Swelling noted.  Foot warm to touch, pedal pulse present, cap refill wnl.

## 2013-07-02 NOTE — Discharge Instructions (Signed)
Ankle Fracture °A fracture is a break in a bone. A cast or splint may be used to protect the ankle and heal the break. Sometimes, surgery is needed. °HOME CARE °· Use crutches as told by your doctor. It is very important that you use your crutches correctly. °· Do not put weight or pressure on the injured ankle until told by your doctor. °· Keep your ankle raised (elevated) when sitting or lying down. °· Apply ice to the ankle: °· Put ice in a plastic bag. °· Place a towel between your cast and the bag. °· Leave the ice on for 20 minutes, 2 3 times a day. °· If you have a plaster or fiberglass cast: °· Do not try to scratch under the cast with any objects. °· Check the skin around the cast every day. You may put lotion on red or sore areas. °· Keep your cast dry and clean. °· If you have a plaster splint: °· Wear the splint as told by your doctor. °· You can loosen the elastic around the splint if your toes get numb, tingle, or turn cold or blue. °· Do not put pressure on any part of your cast or splint. It may break. Rest your plaster splint or cast only on a pillow the first 24 hours until it is fully hardened. °· Cover your cast or splint with a plastic bag during showers. °· Do not lower your cast or splint into water. °· Take medicine as told by your doctor. °· Do not drive until your doctor says it is safe. °· Follow-up with your doctor as told. It is very important that you go to your follow-up visits. °GET HELP IF: °The swelling and discomfort gets worse.  °GET HELP RIGHT AWAY IF:  °· Your splint or cast breaks. °· You continue to have very bad pain. °· You have new pain or swelling after your splint or cast was put on. °· Your skin or toes below the injured ankle: °· Turn blue or gray. °· Feel cold, numb, or you cannot feel them. °· There is a bad smell or yellowish white fluid (pus) coming from under the splint or cast. °MAKE SURE YOU:  °· Understand these instructions. °· Will watch your  condition. °· Will get help right away if you are not doing well or get worse. °Document Released: 01/01/2009 Document Revised: 12/25/2012 Document Reviewed: 10/03/2012 °[employer]® Patient Information ©2014 [employer]. ° °

## 2013-07-02 NOTE — ED Notes (Signed)
Pt twisted his rt ankle 1 hour ago, pt takes xarelto and wanted to get checked out. Pt's ankle appears swollen and bruised.

## 2013-07-04 NOTE — ED Provider Notes (Signed)
CSN: [id number]     Arrival date & time 07/02/13  1538 History   First MD Initiated Contact with Patient 07/02/13 1807     Chief Complaint  Patient presents with  . Foot Injury    rt     (Consider location/radiation/quality/duration/timing/severity/associated sxs/prior Treatment) Patient is a 53 y.o. male presenting with ankle pain. The history is provided by the patient.  Ankle Pain Location:  Ankle Time since incident:  1 hour Injury: yes   Mechanism of injury comment:  Twsting injury to the ankle after stepping on an uneven surface Ankle location:  R ankle Pain details:    Quality:  Aching and throbbing   Radiates to:  Does not radiate   Severity:  Moderate   Onset quality:  Sudden   Timing:  Constant   Progression:  Unchanged Chronicity:  New Dislocation: no   Foreign body present:  No foreign bodies Prior injury to area:  No Relieved by:  Rest Worsened by:  Activity and bearing weight Ineffective treatments:  Ice Associated symptoms: swelling   Associated symptoms: no back pain, no decreased ROM, no fever, no muscle weakness, no numbness and no stiffness   Risk factors comment:  Patient taking Xarelto   Past Medical History  Diagnosis Date  . PNA (pneumonia)   . PE (pulmonary thromboembolism)    Past Surgical History  Procedure Laterality Date  . Incision and drainage of wound  07/06/2011    Procedure: IRRIGATION AND DEBRIDEMENT WOUND;  Surgeon: Tami RibasKevin R Kuzma, MD;  [facility];  Service: Orthopedics;  Laterality: Left;  . Knee surgery Right   . Hand surgery Left    History reviewed. No pertinent family history. History  Substance Use Topics  . Smoking status: Former Games developermoker  . Smokeless tobacco: Not on file  . Alcohol Use: No    Review of Systems  Constitutional: Negative for fever and chills.  Genitourinary: Negative for dysuria and difficulty urinating.  Musculoskeletal: Positive for arthralgias and joint swelling. Negative for back pain and  stiffness.  Skin: Negative for color change and wound.  All other systems reviewed and are negative.     Allergies  Poison sumac extract  Home Medications   Prior to Admission medications   Medication Sig Start Date End Date Taking? Authorizing Provider  acetaminophen (TYLENOL) 500 MG tablet Take 500 mg by mouth every 6 (six) hours as needed.   Yes Historical Provider, MD  Rivaroxaban (XARELTO) 20 MG TABS tablet Take 1 tablet (20 mg total) by mouth daily with supper. Start on 03/19/13 after finished with 15mg  dose 03/18/13  Yes Catarina Hartshornavid Tat, MD  albuterol (PROVENTIL HFA;VENTOLIN HFA) 108 (90 BASE) MCG/ACT inhaler Inhale 2 puffs into the lungs every 6 (six) hours as needed for wheezing or shortness of breath.    Historical Provider, MD  HYDROcodone-acetaminophen (NORCO/VICODIN) 5-325 MG per tablet Take one-two tabs po q 4-6 hrs prn pain 07/02/13   Eilidh Marcano L. Cortina Vultaggio, PA-C   BP 131/90  Pulse 64  Temp(Src) 98.2 F (36.8 C) (Oral)  Resp 18  Ht 6\' 2"  (1.88 m)  Wt 228 lb (103.42 kg)  BMI 29.26 kg/m2  SpO2 100% Physical Exam  Nursing note and vitals reviewed. Constitutional: He is oriented to person, place, and time. He appears well-developed and well-nourished. No distress.  HENT:  Head: Normocephalic and atraumatic.  Cardiovascular: Normal rate, regular rhythm, normal heart sounds and intact distal pulses.   No murmur heard. Pulmonary/Chest: Effort normal and breath sounds normal. No  respiratory distress.  Musculoskeletal: He exhibits edema and tenderness.  Lateral right ankle is ttp, moderate STS is present.  ROM is preserved.  DP pulse is brisk,distal sensation intact.  No erythema, abrasion, bruising or bony deformity.  No proximal tenderness. Compartments soft  Neurological: He is alert and oriented to person, place, and time. He exhibits normal muscle tone. Coordination normal.  Skin: Skin is warm and dry.    ED Course  Procedures (including critical care time) Labs  Review Labs Reviewed - No data to display  Imaging Review Dg Ankle Complete Right  07/02/2013   CLINICAL DATA:  Right ankle pain following injury  EXAM: RIGHT ANKLE - COMPLETE 3+ VIEW  COMPARISON:  None.  FINDINGS: Considerable soft tissue swelling is noted laterally. A small avulsion fractures noted from the distal aspect of the lateral malleolus. No other fracture is identified.  IMPRESSION: Avulsion fracture from the lateral malleolus.   Electronically Signed   By: Alcide CleverMark  Lukens M.D.   On: 07/02/2013 16:12     EKG Interpretation None      MDM   Final diagnoses:  Fracture of malleolus, right ankle, closed    Patient currently on Xarelto for diagnosed DVT/PE 12/14.  He has moderate STS to the lateral right ankle, no ecchymosis at present.  Will apply ACE wrap to the ankle and ASO splint applied.  Crutches given.  Pt agrees to RICE therapy and orthopedic f/u, referral given for Dr. Hilda LiasKeeling.  Pt stable for discharge.      Salik Grewell L. Trisha Mangleriplett, PA-C 07/04/13 1433

## 2013-07-05 NOTE — ED Provider Notes (Signed)
Medical screening examination/treatment/procedure(s) were performed by non-physician practitioner and as supervising physician I was immediately available for consultation/collaboration.   EKG Interpretation None        Marthe Dant, MD 07/05/13 2346 

## 2013-10-06 DIAGNOSIS — I82431 Acute embolism and thrombosis of right popliteal vein: Secondary | ICD-10-CM

## 2013-10-06 HISTORY — DX: Acute embolism and thrombosis of right popliteal vein: I82.431

## 2013-10-13 ENCOUNTER — Other Ambulatory Visit (HOSPITAL_COMMUNITY): Payer: Self-pay | Admitting: Physician Assistant

## 2013-10-13 DIAGNOSIS — I2699 Other pulmonary embolism without acute cor pulmonale: Secondary | ICD-10-CM

## 2013-10-14 ENCOUNTER — Ambulatory Visit (HOSPITAL_COMMUNITY)
Admission: RE | Admit: 2013-10-14 | Discharge: 2013-10-14 | Disposition: A | Discharge status: Home / Self Care | Payer: Medicaid Other | Source: Ambulatory Visit | Attending: Physician Assistant | Admitting: Physician Assistant

## 2013-10-14 DIAGNOSIS — I2699 Other pulmonary embolism without acute cor pulmonale: Secondary | ICD-10-CM | POA: Insufficient documentation

## 2013-10-14 DIAGNOSIS — I82891 Chronic embolism and thrombosis of other specified veins: Secondary | ICD-10-CM | POA: Diagnosis not present

## 2013-11-06 ENCOUNTER — Ambulatory Visit (INDEPENDENT_AMBULATORY_CARE_PROVIDER_SITE_OTHER): Payer: Medicaid Other | Admitting: Otolaryngology

## 2013-11-06 DIAGNOSIS — H902 Conductive hearing loss, unspecified: Secondary | ICD-10-CM

## 2013-11-06 DIAGNOSIS — H9319 Tinnitus, unspecified ear: Secondary | ICD-10-CM

## 2013-11-06 DIAGNOSIS — H7190 Unspecified cholesteatoma, unspecified ear: Secondary | ICD-10-CM

## 2013-11-11 ENCOUNTER — Other Ambulatory Visit (INDEPENDENT_AMBULATORY_CARE_PROVIDER_SITE_OTHER): Payer: Self-pay | Admitting: Otolaryngology

## 2013-11-11 DIAGNOSIS — H9190 Unspecified hearing loss, unspecified ear: Secondary | ICD-10-CM

## 2013-11-14 ENCOUNTER — Ambulatory Visit (HOSPITAL_COMMUNITY)
Admission: RE | Admit: 2013-11-14 | Discharge: 2013-11-14 | Disposition: A | Discharge status: Home / Self Care | Payer: Medicaid Other | Source: Ambulatory Visit | Attending: Otolaryngology | Admitting: Otolaryngology

## 2013-11-14 DIAGNOSIS — Z9889 Other specified postprocedural states: Secondary | ICD-10-CM | POA: Insufficient documentation

## 2013-11-14 DIAGNOSIS — H7390 Unspecified disorder of tympanic membrane, unspecified ear: Secondary | ICD-10-CM | POA: Insufficient documentation

## 2013-11-14 DIAGNOSIS — H9190 Unspecified hearing loss, unspecified ear: Secondary | ICD-10-CM

## 2013-11-19 ENCOUNTER — Encounter: Payer: Self-pay | Admitting: Cardiovascular Disease

## 2013-11-19 ENCOUNTER — Ambulatory Visit (INDEPENDENT_AMBULATORY_CARE_PROVIDER_SITE_OTHER): Payer: Medicaid Other | Admitting: Cardiovascular Disease

## 2013-11-19 VITALS — BP 126/88 | HR 68 | Ht 74.0 in | Wt 225.0 lb

## 2013-11-19 DIAGNOSIS — I82409 Acute embolism and thrombosis of unspecified deep veins of unspecified lower extremity: Secondary | ICD-10-CM

## 2013-11-19 DIAGNOSIS — I82401 Acute embolism and thrombosis of unspecified deep veins of right lower extremity: Secondary | ICD-10-CM

## 2013-11-19 DIAGNOSIS — D689 Coagulation defect, unspecified: Secondary | ICD-10-CM

## 2013-11-19 DIAGNOSIS — I2699 Other pulmonary embolism without acute cor pulmonale: Secondary | ICD-10-CM

## 2013-11-19 DIAGNOSIS — R079 Chest pain, unspecified: Secondary | ICD-10-CM

## 2013-11-19 DIAGNOSIS — Z7901 Long term (current) use of anticoagulants: Secondary | ICD-10-CM

## 2013-11-19 NOTE — Patient Instructions (Signed)
Your physician recommends that you schedule a follow-up appointment as needed  You have been referred to Hematology  Your physician recommends that you continue on your current medications as directed. Please refer to the Current Medication list given to you today.  Thank you for choosing [facility]!!

## 2013-11-19 NOTE — Progress Notes (Signed)
Patient ID: [name], male   DOB: Aug 09, 1960, 53 y.o.   MRN: [id number]       CARDIOLOGY CONSULT NOTE  Patient ID: [name] MRN: [id number] DOB/AGE: [birth year] 53 y.o.  Admit date: (Not on file) Primary Physician [name], FNP  Reason for Consultation: PE, DVT, anticoagulation  HPI: The patient is a 53 year old male with a history of right lower lobe pulmonary embolism diagnosed in December 2014 along with right popliteal vein DVT. He has been maintained on Xarelto. 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 I personally reviewed his echocardiographic images performed in December 2014 which demonstrated normal left ventricular systolic and diastolic function with no significant valvular pathology. There is no evidence of right ventricular strain. There was mild left atrial enlargement. At the time of his diagnosis, there was some thought that it may have been due to a 9 hour car trip to [place] over the Thanksgiving weekend. To the best of my knowledge, I do not see that a hypercoagulopathy was identified.  He believes his father had a long clot when the patient was 88 or 53 years old. He believes he had a prior PE in 2009 as his symptoms were similar to his presentation for pulmonary embolism in 2014. However, as he did not have insurance, he was never officially evaluated. He is uncertain if he has had a workup for hypercoagulopathy. He does not smoke. 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 He tries to stay active and denies any exertional chest pain, shortness of breath, palpitations, lightheadedness, dizziness, orthopnea, PND, leg swelling and syncope. On Tuesday he experienced some mild right lower chest tightness which resolved spontaneously within one to 2 hours. This is the first episode of  chest discomfort he has had since his pulmonary embolism. He occasionally has some right calf cramping.  ECG performed today demonstrates normal sinus rhythm with a heart rate of 63 beats per minute.  Soc: Nonsmoker. Farms, rides a motorcycle.   Allergies  Allergen Reactions  . Poison Sumac Extract     Current Outpatient Prescriptions  Medication Sig Dispense Refill  . acetaminophen (TYLENOL) 500 MG tablet Take 500 mg by mouth every 6 (six) hours as needed.      . Rivaroxaban (XARELTO) 20 MG TABS tablet Take 1 tablet (20 mg total) by mouth daily with supper. Start on 03/19/13 after finished with  dose  30 tablet  1   No current facility-administered medications for this visit.    Past Medical History  Diagnosis Date  . PNA (pneumonia)   . PE (pulmonary thromboembolism)     Past Surgical History  Procedure Laterality Date  . Incision and drainage of wound  07/06/2011    Procedure: IRRIGATION AND DEBRIDEMENT WOUND;  Surgeon: Tami Ribas, MD;  [facility]: [facility];  Service: Orthopedics;  Laterality: Left;  . Knee surgery Right   . Hand surgery Left     History   Social History  . Marital Status: Divorced    Spouse Name: N/A    Number of Children: N/A  . Years of Education: N/A   Occupational History  . Not on file.   Social History Main Topics  . Smoking status: Former Smoker -- 2.00 packs/day    Types: Cigarettes    Quit date: 11/20/1983  . Smokeless tobacco: Never Used  .  Alcohol Use: No  . Drug Use: No  . Sexual Activity: Not on file   Other Topics Concern  . Not on file   Social History Narrative  . No narrative on file     No family history of premature CAD in 1st degree relatives. Father with possible PE.  Prior to Admission medications   Medication Sig Start Date End Date Taking? Authorizing Provider  acetaminophen (TYLENOL) 500 MG tablet Take 500 mg by mouth every 6 (six) hours as needed.    Historical Provider, MD  Rivaroxaban (XARELTO) 20 MG  TABS tablet Take 1 tablet (20 mg total) by mouth daily with supper. Start on 03/19/13 after finished with  dose 03/18/13   Catarina Hartshorn, MD     Review of systems complete and found to be negative unless listed above in HPI     Physical exam Blood pressure 126/88, pulse 68, height  (1.88 m), weight 225 lb (102.059 kg), SpO2 97.00%. General: NAD Neck: No JVD, no thyromegaly or thyroid nodule.  Lungs: Clear to auscultation bilaterally with normal respiratory effort. CV: Nondisplaced PMI. Regular rate and rhythm, normal S1/S2, no S3/S4, no murmur.  No peripheral edema.  No carotid bruit.  Normal pedal pulses.  Abdomen: Soft, nontender, no hepatosplenomegaly, no distention.  Skin: Intact without lesions or rashes.  Neurologic: Alert and oriented x 3.  Psych: Normal affect. Extremities: No clubbing or cyanosis.  HEENT: Normal.   ECG: Most recent ECG reviewed.  Labs:   Lab Results  Component Value Date   WBC 9.6 02/26/2013   HGB 14.6 02/26/2013   HCT 43.4 02/26/2013   MCV 91.9 02/26/2013   PLT 305 02/26/2013   No results found for this basename: NA, K, CL, CO2, BUN, CREATININE, CALCIUM, LABALBU, PROT, BILITOT, ALKPHOS, ALT, AST, GLUCOSE,  in the last 168 hours No results found for this basename: CKTOTAL, CKMB, CKMBINDEX, TROPONINI    No results found for this basename: CHOL   No results found for this basename: HDL   No results found for this basename: LDLCALC   No results found for this basename: TRIG   No results found for this basename: CHOLHDL   No results found for this basename: LDLDIRECT         Studies: No results found.  ASSESSMENT AND PLAN:  1. DVT/PE: It is unclear to me whether he has had an extensive workup for hypercoagulopathy. His father may have had a pulmonary embolism pointing to a genetic predisposition. As it is unclear to me whether this was a provoked episode in 2014, and given the fact that he had similar symptoms in 2009 although the  diagnosis is unclear, I would recommend that he continue on Xarelto 20 mg daily as he has not had any bleeding problems. I will make a referral to hematology for further hypercoagulable workup.  2. Chest pain: He denies exertional chest pain and shortness of breath. He had one isolated episode of atypical chest pain on the right side of his chest which spontaneously resolved. His ECG is entirely normal. Echocardiogram in December 2014 was normal and I do not feel that this needs to be repeated. This does not appear to be cardiac in etiology. He also lacks traditional risk factors such as tobacco use, hypertension and hyperlipidemia.  Dispo: f/u prn.  Signed: Prentice Docker, M.D., F.A.C.C.  11/19/2013, 9:11 AM

## 2013-11-20 ENCOUNTER — Ambulatory Visit (INDEPENDENT_AMBULATORY_CARE_PROVIDER_SITE_OTHER): Payer: Medicaid Other | Admitting: Otolaryngology

## 2013-11-20 DIAGNOSIS — H7120 Cholesteatoma of mastoid, unspecified ear: Secondary | ICD-10-CM

## 2013-12-04 ENCOUNTER — Encounter (HOSPITAL_COMMUNITY): Payer: Medicaid Other | Attending: Hematology and Oncology

## 2013-12-04 ENCOUNTER — Ambulatory Visit (HOSPITAL_COMMUNITY): Payer: Medicaid Other

## 2013-12-04 ENCOUNTER — Encounter (HOSPITAL_COMMUNITY): Payer: Self-pay

## 2013-12-04 VITALS — BP 126/78 | HR 60 | Temp 98.9°F | Resp 18 | Wt 226.0 lb

## 2013-12-04 DIAGNOSIS — I82409 Acute embolism and thrombosis of unspecified deep veins of unspecified lower extremity: Secondary | ICD-10-CM | POA: Diagnosis not present

## 2013-12-04 DIAGNOSIS — I82401 Acute embolism and thrombosis of unspecified deep veins of right lower extremity: Secondary | ICD-10-CM

## 2013-12-04 DIAGNOSIS — I2699 Other pulmonary embolism without acute cor pulmonale: Secondary | ICD-10-CM | POA: Diagnosis present

## 2013-12-04 DIAGNOSIS — Z86711 Personal history of pulmonary embolism: Secondary | ICD-10-CM

## 2013-12-04 DIAGNOSIS — J189 Pneumonia, unspecified organism: Secondary | ICD-10-CM | POA: Insufficient documentation

## 2013-12-04 DIAGNOSIS — J181 Lobar pneumonia, unspecified organism: Secondary | ICD-10-CM

## 2013-12-04 DIAGNOSIS — J449 Chronic obstructive pulmonary disease, unspecified: Secondary | ICD-10-CM

## 2013-12-04 DIAGNOSIS — Z23 Encounter for immunization: Secondary | ICD-10-CM

## 2013-12-04 LAB — COMPREHENSIVE METABOLIC PANEL
ALK PHOS: 80 U/L (ref 39–117)
ALT: 38 U/L (ref 0–53)
ANION GAP: 13 (ref 5–15)
AST: 20 U/L (ref 0–37)
Albumin: 3.8 g/dL (ref 3.5–5.2)
BILIRUBIN TOTAL: 0.2 mg/dL — AB (ref 0.3–1.2)
BUN: 14 mg/dL (ref 6–23)
CHLORIDE: 105 meq/L (ref 96–112)
CO2: 24 mEq/L (ref 19–32)
Calcium: 9.3 mg/dL (ref 8.4–10.5)
Creatinine, Ser: 0.87 mg/dL (ref 0.50–1.35)
GFR calc Af Amer: >90 mL/min (ref >90)
GLUCOSE: 106 mg/dL — AB (ref 70–99)
POTASSIUM: 3.9 meq/L (ref 3.7–5.3)
Sodium: 142 mEq/L (ref 137–147)
Total Protein: 7.3 g/dL (ref 6.0–8.3)

## 2013-12-04 LAB — CBC WITH DIFFERENTIAL/PLATELET
Basophils Absolute: 0 10*3/uL (ref 0.0–0.1)
Basophils Relative: 0 % (ref 0–1)
Eosinophils Absolute: 0.4 10*3/uL (ref 0.0–0.7)
Eosinophils Relative: 5 % (ref 0–5)
HEMATOCRIT: 43.5 % (ref 39.0–52.0)
HEMOGLOBIN: 15.3 g/dL (ref 13.0–17.0)
Lymphocytes Relative: 29 % (ref 12–46)
Lymphs Abs: 2.3 10*3/uL (ref 0.7–4.0)
MCH: 32.1 pg (ref 26.0–34.0)
MCHC: 35.2 g/dL (ref 30.0–36.0)
MCV: 91.2 fL (ref 78.0–100.0)
MONO ABS: 0.7 10*3/uL (ref 0.1–1.0)
MONOS PCT: 9 % (ref 3–12)
NEUTROS ABS: 4.6 10*3/uL (ref 1.7–7.7)
Neutrophils Relative %: 57 % (ref 43–77)
Platelets: 262 10*3/uL (ref 150–400)
RBC: 4.77 MIL/uL (ref 4.22–5.81)
RDW: 13.5 % (ref 11.5–15.5)
WBC: 8 10*3/uL (ref 4.0–10.5)

## 2013-12-04 LAB — LACTATE DEHYDROGENASE: LDH: 159 U/L (ref 94–250)

## 2013-12-04 LAB — D-DIMER, QUANTITATIVE: D-Dimer, Quant: 0.3 ug/mL-FEU (ref 0.00–0.48)

## 2013-12-04 MED ORDER — INFLUENZA VAC SPLIT QUAD 0.5 ML IM SUSY
0.5000 mL | PREFILLED_SYRINGE | Freq: Once | INTRAMUSCULAR | Status: AC
  Administered 2013-12-04: 0.5 mL via INTRAMUSCULAR
  Filled 2013-12-04: qty 0.5

## 2013-12-04 NOTE — Progress Notes (Signed)
[facility] Earl Lites A. Zigmund Daniel, M.D.  NEW PATIENT EVALUATION   Name: [name] Date: 12/04/2013 MRN: [id number] DOB: 14-Jul-1960  PCP: [name], FNP   REFERRING PHYSICIAN: [name], *  REASON FOR REFERRAL: Pulmonary embolism     HISTORY OF PRESENT ILLNESS:[name] is a 53 y.o. male who is referred by his family physician and cardiologist for evaluation of possible thrombophilic syndrome. The patient did have one episode of shortness of breath last week while taking tomatoes. Appetite has been good with no cough, shortness of breath, PND, orthopnea, or palpitations. He did have signs and symptoms of a pulmonary embolism in 2009 and in December of 2014 was documented to have a pulmonary embolism in the right lower lobe pulmonary artery distribution by CT scan. He fractured his right knee 20 years ago and was found to have a right popliteal vein organizing thrombus on ultrasound done in July 2015. Currently he denies any lower extremity pain except for slight discomfort on the anterior portion of the right knee. Appetite is good with no nausea, vomiting, diarrhea, constipation, melena, hematochezia, hematuria, epistaxis, or hemoptysis while taking Xarelto 20 mg daily.   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     PAST SURGICAL HISTORY: Past Surgical History  Procedure Laterality Date  . Incision and drainage of wound  07/06/2011    Procedure: IRRIGATION AND DEBRIDEMENT WOUND;  Surgeon: Tami Ribas, MD;  Location: [facility] OR;  Service: Orthopedics;  Laterality: Left;  . Knee surgery Right   . Hand surgery Left      CURRENT MEDICATIONS: has a current medication list which includes the following prescription(s): acetaminophen and  rivaroxaban.   ALLERGIES: Poison sumac extract   SOCIAL HISTORY:  reports that he quit smoking about 30 years ago. His smoking use included Cigarettes. He smoked 2.00 packs per day. He has never used smokeless tobacco. He reports that he does not drink alcohol or use illicit drugs.   FAMILY HISTORY: family history includes Clotting disorder in his father.    REVIEW OF SYSTEMS:  Other than that discussed above is noncontributory.    PHYSICAL EXAM:  weight is 226 lb (102.513 kg). His oral temperature is 98.9 F (37.2 C). His blood pressure is 126/78 and his pulse is 60. His respiration is 18 and oxygen saturation is 100%.    GENERAL:alert, no distress and comfortable SKIN: skin color, texture, turgor are normal, no rashes or significant lesions EYES: normal, Conjunctiva are pink and non-injected, sclera clear OROPHARYNX:no exudate, no erythema and lips, buccal mucosa, and tongue normal  NECK: supple, thyroid normal size, non-tender, without nodularity CHEST: Increased AP diameter with no breast masses. No gynecomastia.  LYMPH:  no palpable lymphadenopathy in the cervical, axillary or inguinal LUNGS: clear to auscultation and percussion with normal breathing effort HEART: regular rate & rhythm and no murmurs. P2 is tolerable at the apex.  ABDOMEN:abdomen soft, non-tender and normal bowel sounds MUSCULOSKELETALl:no cyanosis of digits, no clubbing or edema. Full range of motion of the right knee with no edema, cyanosis, clubbing, or dark osseous. Homans sign is negative.  NEURO: alert & oriented x 3 with fluent speech, no focal motor/sensory deficits. Decreased hearing acuity bilaterally.    LABORATORY DATA:  Office Visit on 12/04/2013  Component Date Value  Ref Range Status  . WBC 12/04/2013 8.0  4.0 - 10.5 K/uL Final  . RBC 12/04/2013 4.77  4.22 - 5.81 MIL/uL Final  . Hemoglobin 12/04/2013 15.3  13.0 - 17.0 g/dL Final  . HCT 16/12/9602 43.5  39.0 - 52.0 % Final  . MCV 12/04/2013  91.2  78.0 - 100.0 fL Final  . MCH 12/04/2013 32.1  26.0 - 34.0 pg Final  . MCHC 12/04/2013 35.2  30.0 - 36.0 g/dL Final  . RDW 54/11/8117 13.5  11.5 - 15.5 % Final  . Platelets 12/04/2013 262  150 - 400 K/uL Final  . Neutrophils Relative % 12/04/2013 57  43 - 77 % Final  . Neutro Abs 12/04/2013 4.6  1.7 - 7.7 K/uL Final  . Lymphocytes Relative 12/04/2013 29  12 - 46 % Final  . Lymphs Abs 12/04/2013 2.3  0.7 - 4.0 K/uL Final  . Monocytes Relative 12/04/2013 9  3 - 12 % Final  . Monocytes Absolute 12/04/2013 0.7  0.1 - 1.0 K/uL Final  . Eosinophils Relative 12/04/2013 5  0 - 5 % Final  . Eosinophils Absolute 12/04/2013 0.4  0.0 - 0.7 K/uL Final  . Basophils Relative 12/04/2013 0  0 - 1 % Final  . Basophils Absolute 12/04/2013 0.0  0.0 - 0.1 K/uL Final    Urinalysis No results found for this basename: colorurine,  appearanceur,  labspec,  phurine,  glucoseu,  hgbur,  bilirubinur,  ketonesur,  proteinur,  urobilinogen,  nitrite,  leukocytesur      : Ct Temporal Bones W/o Cm  11/14/2013   CLINICAL DATA:  Hearing loss  EXAM: CT TEMPORAL BONES WITHOUT CONTRAST  TECHNIQUE: Axial and coronal plane CT imaging of the petrous temporal bones was performed with thin-collimation image reconstruction. No intravenous contrast was administered. Multiplanar CT image reconstructions were also generated.  COMPARISON:  None.  FINDINGS: Mild mucosal edema in the paranasal sinuses without air-fluid level. Central skullbase intact. Visualized intracranial contents are negative.  Right temporal bone: Prior mastoidectomy on the right. Changes of chronic mastoiditis. Mastoidectomy cavity shows mild mucosal thickening but no air-fluid level. There is thickening of the tympanic membrane. No middle ear effusion is present. Ossicles are ill-defined and have erosive change. Oval window is normal. Cochlea is normal. Internal auditory canal is normal. External auditory canal is normal. Negative for cholesteatoma.   Left temporal bone: Left mastoid sinus is well developed and clear. Middle ear is clear. Ossicles are normal. Cochlea and inner ear structures are normal. Negative for cholesteatoma  IMPRESSION: Right mastoidectomy. There is thickening of the right tympanic membrane. Erosive changes of the ossicles on the right without middle ear effusion. Negative for cholesteatoma  Negative left temporal bone.   Electronically Signed   By: Marlan Palau M.D.   On: 11/14/2013 09:43    PATHOLOGY:None.    IMPRESSION:  #1. Pulmonary embolism right lower lobe diagnosed in 02/2013 with right popliteal vein thrombosis,  "evolving" thrombus in the right popliteal vein on 10/06/2013, currently on Xarelto 20 mg daily. #2. Chronic obstructive pulmonary disease.   PLAN:  #1. Hypercoagulable panel including homocystine level. Suspect he will need to stay on lifelong anticoagulation even of hyper carpal workup is negative. #2. Influenza virus vaccine given today. #3. Followup on 12/17/2013 for discussion of results with recommendations today regarding long-term anticoagulant therapy.   Maurilio Lovely, MD 12/04/2013 3:42 PM   DISCLAIMER:  This note was dictated with voice recognition softwre.  Similar sounding words can inadvertently be transcribed  inaccurately and may not be corrected upon review.

## 2013-12-04 NOTE — Progress Notes (Signed)
[name] presents today for injection per the provider's orders.  Fluvarix administration without incident; see MAR for injection details.  Patient tolerated procedure well and without incident.  No questions or complaints noted at this time. [name] presented for labwork. Labs per MD order drawn via Peripheral Line 21 gauge needle inserted in right antecubital; good blood return present. Procedure without incident. Needle removed intact. Patient tolerated procedure well.

## 2013-12-04 NOTE — Patient Instructions (Addendum)
[facility] Discharge Instructions  RECOMMENDATIONS MADE BY THE CONSULTANT AND ANY TEST RESULTS WILL BE SENT TO YOUR REFERRING PHYSICIAN.  Return for office visit and blood work on September 30th.  Thank you for choosing [facility] to provide your oncology and hematology care.  To afford each patient quality time with our providers, please arrive at least 15 minutes before your scheduled appointment time.  With your help, our goal is to use those 15 minutes to complete the necessary work-up to ensure our physicians have the information they need to help with your evaluation and healthcare recommendations.    Effective January 1st, 2014, we ask that you re-schedule your appointment with our physicians should you arrive 10 or more minutes late for your appointment.  We strive to give you quality time with our providers, and arriving late affects you and other patients whose appointments are after yours.    Again, thank you for choosing [facility].  Our hope is that these requests will decrease the amount of time that you wait before being seen by our physicians.       _____________________________________________________________  Should you have questions after your visit to [facility], please contact our office at [phone] between the hours of 8:30 a.m. and 4:30 p.m.  Voicemails left after 4:30 p.m. will not be returned until the following business day.  For prescription refill requests, have your pharmacy contact our office with your prescription refill request.    _______________________________________________________________  We hope that we have given you very good care.  You may receive a patient satisfaction survey in the mail, please complete it and return it as soon as possible.  We value your feedback!  _______________________________________________________________  Have you asked about our STAR program?  STAR  stands for Survivorship Training and Rehabilitation, and this is a nationally recognized cancer care program that focuses on survivorship and rehabilitation.  Cancer and cancer treatments may cause problems, such as, pain, making you feel tired and keeping you from doing the things that you need or want to do. Cancer rehabilitation can help. Our goal is to reduce these troubling effects and help you have the best quality of life possible.  You may receive a survey from a nurse that asks questions about your current state of health.  Based on the survey results, all eligible patients will be referred to the [facility] program for an evaluation so we can better serve you!  A frequently asked questions sheet is available upon request.

## 2013-12-05 LAB — LUPUS ANTICOAGULANT PANEL
DRVVT: 39.6 s (ref <42.9)
Lupus Anticoagulant: NOT DETECTED
PTT LA: 35.7 s (ref 28.0–43.0)

## 2013-12-05 LAB — ANTITHROMBIN III: AntiThromb III Func: 98 % (ref 75–120)

## 2013-12-05 LAB — PROTEIN S ACTIVITY: Protein S Activity: 133 % — ABNORMAL HIGH (ref 69–129)

## 2013-12-05 LAB — HOMOCYSTEINE: Homocysteine: 13.2 umol/L (ref 4.0–15.4)

## 2013-12-05 LAB — PROTEIN C ACTIVITY: Protein C Activity: 162 % — ABNORMAL HIGH (ref 75–133)

## 2013-12-08 LAB — CARDIOLIPIN ANTIBODIES, IGG, IGM, IGA
Anticardiolipin IgA: 9 APL U/mL — ABNORMAL LOW (ref <22)
Anticardiolipin IgG: 4 GPL U/mL — ABNORMAL LOW (ref <23)
Anticardiolipin IgM: 0 MPL U/mL — ABNORMAL LOW (ref <11)

## 2013-12-08 LAB — BETA-2-GLYCOPROTEIN I ABS, IGG/M/A
Beta-2 Glyco I IgG: 6 G Units (ref <20)
Beta-2-Glycoprotein I IgA: 13 A Units (ref <20)
Beta-2-Glycoprotein I IgM: 3 M Units (ref <20)

## 2013-12-08 LAB — PROTEIN S, TOTAL: Protein S Ag, Total: 94 % (ref 60–150)

## 2013-12-08 LAB — PROTEIN C, TOTAL: Protein C, Total: 87 % (ref 72–160)

## 2013-12-10 LAB — PROTHROMBIN GENE MUTATION

## 2013-12-10 LAB — FACTOR 5 LEIDEN

## 2013-12-16 ENCOUNTER — Other Ambulatory Visit (HOSPITAL_COMMUNITY): Payer: Self-pay

## 2013-12-16 DIAGNOSIS — I2699 Other pulmonary embolism without acute cor pulmonale: Secondary | ICD-10-CM

## 2013-12-16 DIAGNOSIS — I82401 Acute embolism and thrombosis of unspecified deep veins of right lower extremity: Secondary | ICD-10-CM

## 2013-12-16 NOTE — Progress Notes (Signed)
This encounter was created in error - please disregard.

## 2013-12-17 ENCOUNTER — Encounter (HOSPITAL_COMMUNITY): Payer: Self-pay

## 2013-12-17 ENCOUNTER — Encounter (HOSPITAL_BASED_OUTPATIENT_CLINIC_OR_DEPARTMENT_OTHER): Payer: Medicaid Other

## 2013-12-17 VITALS — BP 117/96 | HR 60 | Temp 97.7°F | Resp 18 | Wt 225.8 lb

## 2013-12-17 DIAGNOSIS — Z7901 Long term (current) use of anticoagulants: Secondary | ICD-10-CM

## 2013-12-17 DIAGNOSIS — I82409 Acute embolism and thrombosis of unspecified deep veins of unspecified lower extremity: Secondary | ICD-10-CM

## 2013-12-17 DIAGNOSIS — I2699 Other pulmonary embolism without acute cor pulmonale: Secondary | ICD-10-CM

## 2013-12-17 DIAGNOSIS — I82401 Acute embolism and thrombosis of unspecified deep veins of right lower extremity: Secondary | ICD-10-CM

## 2013-12-17 DIAGNOSIS — J4489 Other specified chronic obstructive pulmonary disease: Secondary | ICD-10-CM

## 2013-12-17 DIAGNOSIS — J449 Chronic obstructive pulmonary disease, unspecified: Secondary | ICD-10-CM

## 2013-12-17 LAB — CBC WITH DIFFERENTIAL/PLATELET
Basophils Absolute: 0 10*3/uL (ref 0.0–0.1)
Basophils Relative: 0 % (ref 0–1)
Eosinophils Absolute: 0.4 10*3/uL (ref 0.0–0.7)
Eosinophils Relative: 5 % (ref 0–5)
HEMATOCRIT: 44.8 % (ref 39.0–52.0)
HEMOGLOBIN: 15.5 g/dL (ref 13.0–17.0)
LYMPHS ABS: 2.7 10*3/uL (ref 0.7–4.0)
Lymphocytes Relative: 35 % (ref 12–46)
MCH: 31.3 pg (ref 26.0–34.0)
MCHC: 34.6 g/dL (ref 30.0–36.0)
MCV: 90.5 fL (ref 78.0–100.0)
MONOS PCT: 9 % (ref 3–12)
Monocytes Absolute: 0.7 10*3/uL (ref 0.1–1.0)
NEUTROS PCT: 51 % (ref 43–77)
Neutro Abs: 4.1 10*3/uL (ref 1.7–7.7)
Platelets: 267 10*3/uL (ref 150–400)
RBC: 4.95 MIL/uL (ref 4.22–5.81)
RDW: 13.5 % (ref 11.5–15.5)
WBC: 7.8 10*3/uL (ref 4.0–10.5)

## 2013-12-17 LAB — D-DIMER, QUANTITATIVE: D-Dimer, Quant: <0.27 ug/mL-FEU (ref 0.00–0.48)

## 2013-12-17 NOTE — Patient Instructions (Signed)
[facility] Discharge Instructions  RECOMMENDATIONS MADE BY THE CONSULTANT AND ANY TEST RESULTS WILL BE SENT TO YOUR REFERRING PHYSICIAN.  We will not need to schedule a follow up with our clinic.    Thank you for choosing [facility] to provide your oncology and hematology care.  To afford each patient quality time with our providers, please arrive at least 15 minutes before your scheduled appointment time.  With your help, our goal is to use those 15 minutes to complete the necessary work-up to ensure our physicians have the information they need to help with your evaluation and healthcare recommendations.    Effective January 1st, 2014, we ask that you re-schedule your appointment with our physicians should you arrive 10 or more minutes late for your appointment.  We strive to give you quality time with our providers, and arriving late affects you and other patients whose appointments are after yours.    Again, thank you for choosing [facility].  Our hope is that these requests will decrease the amount of time that you wait before being seen by our physicians.       _____________________________________________________________  Should you have questions after your visit to [facility], please contact our office at [phone] between the hours of 8:30 a.m. and 4:30 p.m.  Voicemails left after 4:30 p.m. will not be returned until the following business day.  For prescription refill requests, have your pharmacy contact our office with your prescription refill request.    _______________________________________________________________  We hope that we have given you very good care.  You may receive a patient satisfaction survey in the mail, please complete it and return it as soon as possible.  We value your feedback!  _______________________________________________________________  Have you asked about our STAR program?  STAR  stands for Survivorship Training and Rehabilitation, and this is a nationally recognized cancer care program that focuses on survivorship and rehabilitation.  Cancer and cancer treatments may cause problems, such as, pain, making you feel tired and keeping you from doing the things that you need or want to do. Cancer rehabilitation can help. Our goal is to reduce these troubling effects and help you have the best quality of life possible.  You may receive a survey from a nurse that asks questions about your current state of health.  Based on the survey results, all eligible patients will be [facility] the [facility] for an evaluation so we can better serve you!  A frequently asked questions sheet is available upon request.

## 2013-12-17 NOTE — Progress Notes (Signed)
[name]  OFFICE PROGRESS NOTE  [name], FNP [address] [place] [address]  DIAGNOSIS: Pulmonary embolism  Right leg DVT  Chief Complaint  Patient presents with  . Pulmonary embolism    CURRENT THERAPY: Xarelto 20 mg daily for pulmonary embolism.  INTERVAL HISTORY: [name] 53 y.o. 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  He still gets occasional right lower 70 pain without swelling. Denies any chest pain, PND, orthopnea, palpitations, epistaxis, melena, hematochezia, hematuria, skin rash, headache, or seizures.  MEDICAL HISTORY: Past Medical History  Diagnosis Date  . PNA (pneumonia) 2009  . PE (pulmonary thromboembolism) 2009, 02/2013  . 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    ALLERGIES:  is allergic to poison sumac extract.  MEDICATIONS: has a current medication list which includes the following prescription(s): acetaminophen and rivaroxaban.  SURGICAL HISTORY:  Past Surgical History  Procedure Laterality Date  . Incision and drainage of wound  07/06/2011    Procedure: IRRIGATION AND DEBRIDEMENT WOUND;  Surgeon: Tennis Must, MD;  Location: [facility];  Service: Orthopedics;  Laterality: Left;  . Knee surgery Right   . Hand surgery Left     FAMILY HISTORY: family history includes Clotting disorder in his father.  SOCIAL HISTORY:  reports that he quit smoking about 30 years ago. His smoking use included Cigarettes. He smoked 2.00 packs per day. He has never used smokeless tobacco. He reports that he does not drink alcohol or use illicit drugs.  REVIEW OF SYSTEMS:  Other than that discussed above is  noncontributory.  PHYSICAL EXAMINATION: ECOG PERFORMANCE STATUS: 1 - Symptomatic but completely ambulatory  Blood pressure 117/96, pulse 60, temperature 97.7 F (36.5 C), temperature source Oral, resp. rate 18, weight 225 lb 12.8 oz (102.422 kg), SpO2 98.00%.  GENERAL:alert, no distress and comfortable SKIN: skin color, texture, turgor are normal, no rashes or significant lesions EYES: PERLA; Conjunctiva are pink and non-injected, sclera clear SINUSES: No redness or tenderness over maxillary or ethmoid sinuses OROPHARYNX:no exudate, no erythema on lips, buccal mucosa, or tongue. NECK: supple, thyroid normal size, non-tender, without nodularity. No masses CHEST: Increased AP diameter with no breast masses. LYMPH:  no palpable lymphadenopathy in the cervical, axillary or inguinal LUNGS: clear to auscultation and percussion with normal breathing effort HEART: regular rate & rhythm and no murmurs. ABDOMEN:abdomen soft, non-tender and normal bowel sounds MUSCULOSKELETAL:no cyanosis of digits and no clubbing. Range of motion normal. Minimal swelling or lower extremity with negative Homans sign. NEURO: alert & oriented x 3 with fluent speech, no focal motor/sensory deficits   LABORATORY DATA: Lab on 12/17/2013  Component Date Value Ref Range Status  . WBC 12/17/2013 7.8  4.0 - 10.5 K/uL Final  . RBC 12/17/2013 4.95  4.22 - 5.81 MIL/uL Final  . Hemoglobin 12/17/2013 15.5  13.0 - 17.0 g/dL Final  . HCT 12/17/2013 44.8  39.0 - 52.0 % Final  . MCV 12/17/2013 90.5  78.0 - 100.0 fL Final  . MCH 12/17/2013 31.3  26.0 - 34.0 pg Final  . MCHC 12/17/2013 34.6  30.0 - 36.0 g/dL Final  . RDW 12/17/2013 13.5  11.5 - 15.5 % Final  . Platelets  12/17/2013 267  150 - 400 K/uL Final  . Neutrophils Relative % 12/17/2013 51  43 - 77 % Final  . Neutro Abs 12/17/2013 4.1  1.7 - 7.7 K/uL Final  . Lymphocytes Relative 12/17/2013 35  12 - 46 % Final  . Lymphs Abs 12/17/2013 2.7  0.7 - 4.0 K/uL Final  .  Monocytes Relative 12/17/2013 9  3 - 12 % Final  . Monocytes Absolute 12/17/2013 0.7  0.1 - 1.0 K/uL Final  . Eosinophils Relative 12/17/2013 5  0 - 5 % Final  . Eosinophils Absolute 12/17/2013 0.4  0.0 - 0.7 K/uL Final  . Basophils Relative 12/17/2013 0  0 - 1 % Final  . Basophils Absolute 12/17/2013 0.0  0.0 - 0.1 K/uL Final  . D-Dimer, Quant 12/17/2013 <0.27  0.00 - 0.48 ug/mL-FEU Final   Comment:                                 AT THE INHOUSE ESTABLISHED CUTOFF                          VALUE OF 0.48 ug/mL FEU,                          THIS ASSAY HAS BEEN DOCUMENTED                          IN THE LITERATURE TO HAVE                          A SENSITIVITY AND NEGATIVE                          PREDICTIVE VALUE OF AT LEAST                          98 TO 99%.  THE TEST RESULT                          SHOULD BE CORRELATED WITH                          AN ASSESSMENT OF THE CLINICAL                          PROBABILITY OF DVT / VTE.  Office Visit on 12/04/2013  Component Date Value Ref Range Status  . WBC 12/04/2013 8.0  4.0 - 10.5 K/uL Final  . RBC 12/04/2013 4.77  4.22 - 5.81 MIL/uL Final  . Hemoglobin 12/04/2013 15.3  13.0 - 17.0 g/dL Final  . HCT 12/04/2013 43.5  39.0 - 52.0 % Final  . MCV 12/04/2013 91.2  78.0 - 100.0 fL Final  . MCH 12/04/2013 32.1  26.0 - 34.0 pg Final  . MCHC 12/04/2013 35.2  30.0 - 36.0 g/dL Final  . RDW 12/04/2013 13.5  11.5 - 15.5 % Final  . Platelets 12/04/2013 262  150 - 400 K/uL Final  . Neutrophils Relative % 12/04/2013 57  43 - 77 % Final  . Neutro Abs 12/04/2013 4.6  1.7 - 7.7 K/uL Final  . Lymphocytes Relative 12/04/2013 29  12 - 46 % Final  .  Lymphs Abs 12/04/2013 2.3  0.7 - 4.0 K/uL Final  . Monocytes Relative 12/04/2013 9  3 - 12 % Final  . Monocytes Absolute 12/04/2013 0.7  0.1 - 1.0 K/uL Final  . Eosinophils Relative 12/04/2013 5  0 - 5 % Final  . Eosinophils Absolute 12/04/2013 0.4  0.0 - 0.7 K/uL Final  . Basophils Relative 12/04/2013 0  0 -  1 % Final  . Basophils Absolute 12/04/2013 0.0  0.0 - 0.1 K/uL Final  . Sodium 12/04/2013 142  137 - 147 mEq/L Final  . Potassium 12/04/2013 3.9  3.7 - 5.3 mEq/L Final  . Chloride 12/04/2013 105  96 - 112 mEq/L Final  . CO2 12/04/2013 24  19 - 32 mEq/L Final  . Glucose, Bld 12/04/2013 106* 70 - 99 mg/dL Final  . BUN 12/04/2013 14  6 - 23 mg/dL Final  . Creatinine, Ser 12/04/2013 0.87  0.50 - 1.35 mg/dL Final  . Calcium 12/04/2013 9.3  8.4 - 10.5 mg/dL Final  . Total Protein 12/04/2013 7.3  6.0 - 8.3 g/dL Final  . Albumin 12/04/2013 3.8  3.5 - 5.2 g/dL Final  . AST 12/04/2013 20  0 - 37 U/L Final  . ALT 12/04/2013 38  0 - 53 U/L Final  . Alkaline Phosphatase 12/04/2013 80  39 - 117 U/L Final  . Total Bilirubin 12/04/2013 0.2* 0.3 - 1.2 mg/dL Final  . GFR calc non Af Amer 12/04/2013 >90  >90 mL/min Final  . GFR calc Af Amer 12/04/2013 >90  >90 mL/min Final   Comment: (NOTE)                          The eGFR has been calculated using the CKD EPI equation.                          This calculation has not been validated in all clinical situations.                          eGFR's persistently <90 mL/min signify possible Chronic Kidney                          Disease.  . Anion gap 12/04/2013 13  5 - 15 Final  . LDH 12/04/2013 159  94 - 250 U/L Final   SLIGHT HEMOLYSIS  . AntiThromb III Func 12/04/2013 98  75 - 120 % Final   Performed at [facility]  . Protein C Activity 12/04/2013 162* 75 - 133 % Final   Performed at [facility]  . Protein C, Total 12/04/2013 87  72 - 160 % Final   Comment: ** Please note change in reference range(s). **                          Performed at [facility]  . Protein S Activity 12/04/2013 133* 69 - 129 % Final   Performed at [facility]  . Protein S Ag, Total 12/04/2013 94  60 - 150 % Final   Performed at [facility]  . PTT Lupus Anticoagulant 12/04/2013 35.7  28.0 - 43.0 secs Final  . PTTLA Confirmation  12/04/2013 NOT APPL  <8.0 secs Final  . PTTLA 4:1 Mix 12/04/2013 NOT APPL  28.0 - 43.0 secs Final  . Drvvt 12/04/2013  39.6  <42.9 secs Final  . Drvvt confirmation 12/04/2013 NOT APPL  <1.15 Ratio Final  . dRVVT Incubated 1:1 Mix 12/04/2013 NOT APPL  <42.9 secs Final  . Lupus Anticoagulant 12/04/2013 NOT DETECTED  NOT DETECTED Final   Performed at [facility]  . Beta-2 Glyco I IgG 12/04/2013 6  <20 G Units Final  . Beta-2-Glycoprotein I IgM 12/04/2013 3  <20 M Units Final  . Beta-2-Glycoprotein I IgA 12/04/2013 13  <20 A Units Final   Performed at [facility]  . Homocysteine 12/04/2013 13.2  4.0 - 15.4 umol/L Final   Performed at [facility]  . Interpretation-F5LEID: 12/04/2013 REPORT   Final   Comment: (NOTE)                          This individual is negative (normal) for the Factor V                          Leiden (R506Q) mutation in the Factor V gene.                          Increased risk of thrombophilia can be caused by a                          variety of genetic and non-genetic factors not                          screened for by this assay.  . Recommendations-F5LEID: 12/04/2013 REPORT   Final   FACTOR V LEIDEN (R506Q) MUTATION NOT DETECTED  . Reviewer 12/04/2013 REPORT   Final   Comment: (NOTE)                          Forestine Na, Ph.D.,FACMG,                          Director, Molecular Genetics                          SUPPLEMENTAL INFORMATION                          The Factor V Leiden (S142L) mutation [[id number]:c.                          1601G>A (p.R534Q)] in the Factor V gene is one of the                          most common causes of inherited thrombophilia.  This                          mutation causes resistance to degradation of activated                          Factor V protein by activated Protein C (APC).                          The Factor V Leiden (R506Q) mutation is detected by  amplification of the selected region of Factor V gene                          by polymerase chain reaction (PCR) and fluorescent                          probe hybridization to the targeted region, followed by                          melting curve analysis with a real time PCR system.                          Although rare, false positive or false negative results                          may occur.  All results should be interpreted in                          context of clinical findings, relevant history, and                          other laboratory data.                          Health care providers, please contact your local                          [place] genetic counselor or call                          [phone] [phone]) for assistance with                          interpretation of these results.                          This test was developed and its performance                          characteristics have been determined by [facility], [place], [place].                          It has not been cleared or approved by the U.S.                          Food and Drug Administration. The FDA has determined                          that such clearance or approval is not necessary.                          Performance characteristics refer to the analytical  performance of the test.                          Performed at [facility]  . Interpretation-PTGENE: 12/04/2013 REPORT   Final   Comment: (NOTE)                          This individual is negative (normal) for the G20210A                          mutation in the Prothrombin/Factor II gene.  Increased                          risk of thrombophilia can be caused by a variety of                          genetic and non-genetic factors not screened for by                          this assay.  . Recommendations-PTGENE: 12/04/2013  REPORT   Final   THE G20210A MUTATION NOT DETECTED  . Reviewer 12/04/2013 REPORT   Final   Comment: (NOTE)                          Forestine Na, Ph.D.,FACMG,                          Director, Molecular Genetics                          SUPPLEMENTAL INFORMATION                          The G20210A mutation [[id number]:g.21538G>A (c.*97G>A)]                          in the Prothrombin/Factor II gene is the second most                          common inherited risk factor for thrombosis occurring                          in approximately 2% of Caucasians.  Presence of the                          mutation is associated with an elevation of prothrombin                          levels to about 30% above normal in heterozygotes and                          to 70% above normal in homozygotes.                          The G20210A mutation is detected by amplification of  the selected region of Factor II gene by polymerase                          chain reaction (PCR) and fluorescent probe                          hybridization to the targeted region, followed by                          melting curve analysis with a real time PCR system.                          Although rare, false positive or false negative results                          may occur.  All results should be interpreted in                          context of clinical findings, relevant history, and                          other laboratory data.                          Health care providers, please contact your local                          [place] genetic counselor or call                          [phone]) for assistance with                          interpretation of these results.                          This test was developed and its performance                          characteristics have been determined by [facility], [place], [place].                          It has not been cleared or approved by the U.S.                          Food and Drug Administration. The FDA has determined                          that such clearance or approval is not necessary.                          Performance characteristics refer to the analytical  performance of the test.                          Performed at [facility]  . Anticardiolipin IgG 12/04/2013 4* <23 GPL U/mL Final  . Anticardiolipin IgM 12/04/2013 0* <11 MPL U/mL Final  . Anticardiolipin IgA 12/04/2013 9* <22 APL U/mL Final   Comment: (NOTE)                          Reference Range:  Cardiolipin IgG                            Normal                  <23                            Low Positive (+)        23-35                            Moderate Positive (+)   36-50                            High Positive (+)       >50                          Reference Range:  Cardiolipin IgM                            Normal                  <11                            Low Positive (+)        11-20                            Moderate Positive (+)   21-30                            High Positive (+)       >30                          Reference Range:  Cardiolipin IgA                            Normal                  <22                            Low Positive (+)        22-35                            Moderate Positive (+)   36-45  High Positive (+)       >45                          Performed at [facility]  . D-Dimer, Quant 12/04/2013 0.30  0.00 - 0.48 ug/mL-FEU Final   Comment:                                 AT THE INHOUSE ESTABLISHED CUTOFF                          VALUE OF 0.48 ug/mL FEU,                          THIS ASSAY HAS BEEN DOCUMENTED                          IN THE LITERATURE TO HAVE                          A SENSITIVITY AND NEGATIVE                           PREDICTIVE VALUE OF AT LEAST                          98 TO 99%.  THE TEST RESULT                          SHOULD BE CORRELATED WITH                          AN ASSESSMENT OF THE CLINICAL                          PROBABILITY OF DVT / VTE.    PATHOLOGY: No new pathology.  Urinalysis No results found for this basename: colorurine,  appearanceur,  labspec,  phurine,  glucoseu,  hgbur,  bilirubinur,  ketonesur,  proteinur,  urobilinogen,  nitrite,  leukocytesur    RADIOGRAPHIC STUDIES:  CT Chest W Contrast Status: Final result         PACS Images    Show images for CT Chest W Contrast         Study Result    CLINICAL DATA: Shortness of breath, hemoptysis, solitary pulmonary  nodule  EXAM:  CT CHEST WITH CONTRAST  TECHNIQUE:  Multidetector CT imaging of the chest was performed during  intravenous contrast administration. Sagittal and coronal MPR images  reconstructed from axial data set.  CONTRAST: 65m OMNIPAQUE IOHEXOL 300 MG/ML SOLN  COMPARISON: None  Correlation: Chest radiograph 02/24/2013  FINDINGS:  Aorta normal caliber, grossly normal appearance.  Despite exam not being performed with CTA technique, a filling  defect is identified in the right lower lobe pulmonary artery  consistent with pulmonary embolism.  RV:LV ratio of 0.89 (42.820m48.3mm)  Normal size mediastinal and axillary lymph nodes identified.  Questionable tiny cyst at upper pole of left kidney 11 mm diameter  image 57, incompletely imaged.  Remaining visualized portions of upper abdomen unremarkable.  Tiny right pleural effusion.  Right basilar atelectasis.  Minimal atelectasis at left base.  No pneumothorax or acute osseous findings.  IMPRESSION:  Right lower lobe pulmonary embolus.  Right basilar atelectasis and small right pleural effusion.  Critical Value/emergent results were called by telephone at the time  of interpretation on 02/25/2013 at 1043 hr to [facility],  who  verbally acknowledged these results.  Electronically Signed  By: Lavonia Dana M.D.  On: 02/25/2013 10:      CT Temporal Bones W/O CM Status: Final result         PACS Images    Show images for CT Temporal Bones W/O CM         Study Result    CLINICAL DATA: Hearing loss  EXAM:  CT TEMPORAL BONES WITHOUT CONTRAST  TECHNIQUE:  Axial and coronal plane CT imaging of the petrous temporal bones was  performed with thin-collimation image reconstruction. No intravenous  contrast was administered. Multiplanar CT image reconstructions were  also generated.  COMPARISON: None.  FINDINGS:  Mild mucosal edema in the paranasal sinuses without air-fluid level.  Central skullbase intact. Visualized intracranial contents are  negative.  Right temporal bone: Prior mastoidectomy on the right. Changes of  chronic mastoiditis. Mastoidectomy cavity shows mild mucosal  thickening but no air-fluid level. There is thickening of the  tympanic membrane. No middle ear effusion is present. Ossicles are  ill-defined and have erosive change. Oval window is normal. Cochlea  is normal. Internal auditory canal is normal. External auditory  canal is normal. Negative for cholesteatoma.  Left temporal bone: Left mastoid sinus is well developed and clear.  Middle ear is clear. Ossicles are normal. Cochlea and inner ear  structures are normal. Negative for cholesteatoma  IMPRESSION:  Right mastoidectomy. There is thickening of the right tympanic  membrane. Erosive changes of the ossicles on the right without  middle ear effusion. Negative for cholesteatoma  Negative left temporal bone.  Electronically Signed  By: Franchot Gallo M.D.  On: 11/14/2013      ASSESSMENT:  #1. Pulmonary embolism right lower lobe diagnosed in December 2014 with right popliteal vein thrombosis with a recently involving thrombus in the right popliteal vein on 10/06/2013, currently on Xarelto 20 mg daily and tolerating well. #2.  Chronic obstructive pulmonary disease.   PLAN:  #1. Recommend lifelong anticoagulation even though thrombophilia workup was negative. His past history of multiple thrombotic episodes with pulmonary emboli override the negative laboratory workup in my opinion. #2. No further followup was made in this office.   All questions were answered. The patient knows to call the clinic with any problems, questions or concerns. We can certainly see the patient much sooner if necessary.   I spent 25 minutes counseling the patient face to face. The total time spent in the appointment was 30 minutes.    Doroteo Bradford, MD 12/17/2013 1:27 PM  DISCLAIMER:  This note was dictated with voice recognition software.  Similar sounding words can inadvertently be transcribed inaccurately and may not be corrected upon review.

## 2013-12-17 NOTE — Progress Notes (Signed)
LABS FOR DDIM,CBCD 

## 2014-04-02 ENCOUNTER — Ambulatory Visit (INDEPENDENT_AMBULATORY_CARE_PROVIDER_SITE_OTHER): Payer: Medicaid Other | Admitting: Otolaryngology

## 2014-04-02 DIAGNOSIS — H7011 Chronic mastoiditis, right ear: Secondary | ICD-10-CM

## 2014-04-23 ENCOUNTER — Encounter (HOSPITAL_COMMUNITY): Payer: Self-pay | Admitting: *Deleted

## 2014-04-23 ENCOUNTER — Emergency Department (HOSPITAL_COMMUNITY)
Admission: EM | Admit: 2014-04-23 | Discharge: 2014-04-23 | Disposition: A | Discharge status: Home / Self Care | Payer: Medicaid Other | Attending: Emergency Medicine | Admitting: Emergency Medicine

## 2014-04-23 DIAGNOSIS — Z86718 Personal history of other venous thrombosis and embolism: Secondary | ICD-10-CM | POA: Insufficient documentation

## 2014-04-23 DIAGNOSIS — Z8619 Personal history of other infectious and parasitic diseases: Secondary | ICD-10-CM | POA: Insufficient documentation

## 2014-04-23 DIAGNOSIS — Z86711 Personal history of pulmonary embolism: Secondary | ICD-10-CM | POA: Insufficient documentation

## 2014-04-23 DIAGNOSIS — J189 Pneumonia, unspecified organism: Secondary | ICD-10-CM | POA: Insufficient documentation

## 2014-04-23 DIAGNOSIS — X58XXXA Exposure to other specified factors, initial encounter: Secondary | ICD-10-CM | POA: Insufficient documentation

## 2014-04-23 DIAGNOSIS — Y998 Other external cause status: Secondary | ICD-10-CM | POA: Diagnosis not present

## 2014-04-23 DIAGNOSIS — Y9289 Other specified places as the place of occurrence of the external cause: Secondary | ICD-10-CM | POA: Insufficient documentation

## 2014-04-23 DIAGNOSIS — S3992XA Unspecified injury of lower back, initial encounter: Secondary | ICD-10-CM | POA: Diagnosis present

## 2014-04-23 DIAGNOSIS — S39012A Strain of muscle, fascia and tendon of lower back, initial encounter: Secondary | ICD-10-CM | POA: Insufficient documentation

## 2014-04-23 DIAGNOSIS — Z79899 Other long term (current) drug therapy: Secondary | ICD-10-CM | POA: Diagnosis not present

## 2014-04-23 DIAGNOSIS — Z7901 Long term (current) use of anticoagulants: Secondary | ICD-10-CM | POA: Insufficient documentation

## 2014-04-23 DIAGNOSIS — Z87891 Personal history of nicotine dependence: Secondary | ICD-10-CM | POA: Insufficient documentation

## 2014-04-23 DIAGNOSIS — Y9389 Activity, other specified: Secondary | ICD-10-CM | POA: Insufficient documentation

## 2014-04-23 HISTORY — DX: Zoster without complications: B02.9

## 2014-04-23 MED ORDER — OXYCODONE-ACETAMINOPHEN 5-325 MG PO TABS: 1.0000 | ORAL_TABLET | ORAL | Status: DC | PRN

## 2014-04-23 MED ORDER — CYCLOBENZAPRINE HCL 10 MG PO TABS: 10.0000 mg | ORAL_TABLET | Freq: Three times a day (TID) | ORAL | Status: DC | PRN

## 2014-04-23 MED ORDER — CYCLOBENZAPRINE HCL 10 MG PO TABS
10.0000 mg | ORAL_TABLET | Freq: Once | ORAL | Status: AC
  Administered 2014-04-23: 10 mg via ORAL
  Filled 2014-04-23: qty 1

## 2014-04-23 NOTE — ED Notes (Signed)
Low back pain ,onset this am when chopping wood.

## 2014-04-23 NOTE — ED Provider Notes (Signed)
CSN: [id number]     Arrival date & time 04/23/14  2020 History   First MD Initiated Contact with Patient 04/23/14 2154     Chief Complaint  Patient presents with  . Back Pain     (Consider location/radiation/quality/duration/timing/severity/associated sxs/prior Treatment) HPI  Daphane ShepherdLarry Vandenberghe is a 54 y.o. male who presents to the Emergency Department complaining of diffuse low back pain after chopping wood. He reports sudden onset of pain. Describes pain as sharp sensation that radiates across his lower back. Pain is worse with movement and certain positions. He reports similar symptoms in the past. He is taking Tylenol earlier this evening with minimal relief. He denies numbness or weakness to his extremities, urine or bowel changes, abdominal pain, or pain radiating to his legs.   Past Medical History  Diagnosis Date  . PNA (pneumonia) 2009  . PE (pulmonary thromboembolism) 2009, 02/2013  . chronic deep vein thrombosis (DVT) of popliteal vein of right lower extremity 10/06/2013  . Shingles    Past Surgical History  Procedure Laterality Date  . Incision and drainage of wound  07/06/2011    Procedure: IRRIGATION AND DEBRIDEMENT WOUND;  Surgeon: Tami RibasKevin R Kuzma, MD;  [facility];  Service: Orthopedics;  Laterality: Left;  . Knee surgery Right   . Hand surgery Left    Family History  Problem Relation Age of Onset  . Clotting disorder Father    History  Substance Use Topics  . Smoking status: Former Smoker -- 2.00 packs/day    Types: Cigarettes    Quit date: 11/20/1983  . Smokeless tobacco: Never Used  . Alcohol Use: No    Review of Systems  Constitutional: Negative for fever.  Respiratory: Negative for shortness of breath.   Gastrointestinal: Negative for vomiting, abdominal pain and constipation.  Genitourinary: Negative for dysuria, hematuria, flank pain, decreased urine volume and difficulty urinating.  Musculoskeletal: Positive for back pain. Negative for joint swelling.   Skin: Negative for rash.  Neurological: Negative for weakness and numbness.  All other systems reviewed and are negative.     Allergies  Poison sumac extract  Home Medications   Prior to Admission medications   Medication Sig Start Date End Date Taking? Authorizing Provider  acetaminophen (TYLENOL) 500 MG tablet Take 500 mg by mouth every 6 (six) hours as needed.    Historical Provider, MD  cyclobenzaprine (FLEXERIL) 10 MG tablet Take 1 tablet (10 mg total) by mouth 3 (three) times daily as needed. 04/23/14   Nadalyn Deringer L. Faren Florence, PA-C  oxyCODONE-acetaminophen (PERCOCET/ROXICET) 5-325 MG per tablet Take 1 tablet by mouth every 4 (four) hours as needed. 04/23/14   Julias Mould L. Glendon Fiser, PA-C  oxyCODONE-acetaminophen (PERCOCET/ROXICET) 5-325 MG per tablet Take 1 tablet by mouth every 4 (four) hours as needed. 04/23/14   Jameer Storie L. Devonne Kitchen, PA-C  Rivaroxaban (XARELTO) 20 MG TABS tablet Take 1 tablet (20 mg total) by mouth daily with supper. Start on 03/19/13 after finished with 15mg  dose 03/18/13   Catarina Hartshornavid Tat, MD   BP 142/98 mmHg  Pulse 62  Temp(Src) 99 F (37.2 C) (Oral)  Resp 18  Ht 6\' 1"  (1.854 m)  Wt 229 lb (103.874 kg)  BMI 30.22 kg/m2  SpO2 98% Physical Exam  Constitutional: He is oriented to person, place, and time. He appears well-developed and well-nourished. No distress.  HENT:  Head: Normocephalic and atraumatic.  Neck: Normal range of motion. Neck supple.  Cardiovascular: Normal rate, regular rhythm, normal heart sounds and intact distal pulses.   No  murmur heard. Pulmonary/Chest: Effort normal and breath sounds normal. No respiratory distress.  Abdominal: Soft. He exhibits no distension. There is no tenderness.  Musculoskeletal: He exhibits tenderness. He exhibits no edema.       Lumbar back: He exhibits tenderness and pain. He exhibits normal range of motion, no swelling, no deformity, no laceration and normal pulse.  ttp of the bilateral lumbar paraspinal muscles.  No spinal  tenderness.  DP pulses are brisk and symmetrical.  Distal sensation intact.  Hip Flexors/Extensors are intact.  Pt has 5/5 strength against resistance of bilateral lower extremities.     Neurological: He is alert and oriented to person, place, and time. He has normal strength. No sensory deficit. He exhibits normal muscle tone. Coordination and gait normal.  Reflex Scores:      Patellar reflexes are 2+ on the right side and 2+ on the left side.      Achilles reflexes are 2+ on the right side and 2+ on the left side. Skin: Skin is warm and dry. No rash noted.  Nursing note and vitals reviewed.   ED Course  Procedures (including critical care time) Labs Review Labs Reviewed - No data to display  Imaging Review No results found.   EKG Interpretation None      MDM   Final diagnoses:  Lumbar strain, initial encounter    Patient is well appearing, ambulates with a steady gait.  No concerning sx's for emergent neurological or infectious process.  Likely musculoskeletal injury.  Pt agrees to close PMD f/u or to return here for any worsening sx's.    Covey Baller L. Rowe Robert 04/25/14 2234  Raeford Razor, MD 04/27/14 [phone]

## 2014-04-23 NOTE — Discharge Instructions (Signed)

## 2014-04-23 NOTE — ED Notes (Signed)
Discharge instructions given and reviewed with patient.  Prescriptions given for Flexeril and Percocet; effects and use explained.  Patient verbalized understanding to follow up with PMD as needed.  Patient given Percocet pre-pack.  Patient ambulatory with slow, steady gait.

## 2014-04-27 MED FILL — Oxycodone w/ Acetaminophen Tab 5-325 MG: ORAL | Qty: 6 | Status: AC

## 2014-06-25 ENCOUNTER — Ambulatory Visit (INDEPENDENT_AMBULATORY_CARE_PROVIDER_SITE_OTHER): Payer: Medicaid Other | Admitting: Otolaryngology

## 2014-06-25 DIAGNOSIS — H7011 Chronic mastoiditis, right ear: Secondary | ICD-10-CM | POA: Diagnosis not present

## 2014-10-01 ENCOUNTER — Ambulatory Visit (INDEPENDENT_AMBULATORY_CARE_PROVIDER_SITE_OTHER): Payer: Medicaid Other | Admitting: Otolaryngology

## 2014-10-01 DIAGNOSIS — H95121 Granulation of postmastoidectomy cavity, right ear: Secondary | ICD-10-CM

## 2014-12-14 IMAGING — US US EXTREM LOW VENOUS BILAT
1 series · 13 of 24 positions shown · non-contrast
Comparison: CT PE study 02/25/2013

CLINICAL DATA: Acute pulmonary embolus, evaluate for DVT

EXAM:
Bilateral LOWER EXTREMITY VENOUS DOPPLER ULTRASOUND
TECHNIQUE: Gray-scale sonography with graded compression, as well as color
Doppler and duplex ultrasound, were performed to evaluate the deep
venous system from the level of the common femoral vein through the
popliteal and proximal calf veins. Spectral Doppler was utilized to
evaluate flow at rest and with distal augmentation maneuvers.

[Series 1: us extrem low venous bilat · 0.08mm/px · 13 of 69 slices shown]
[im 1/69]
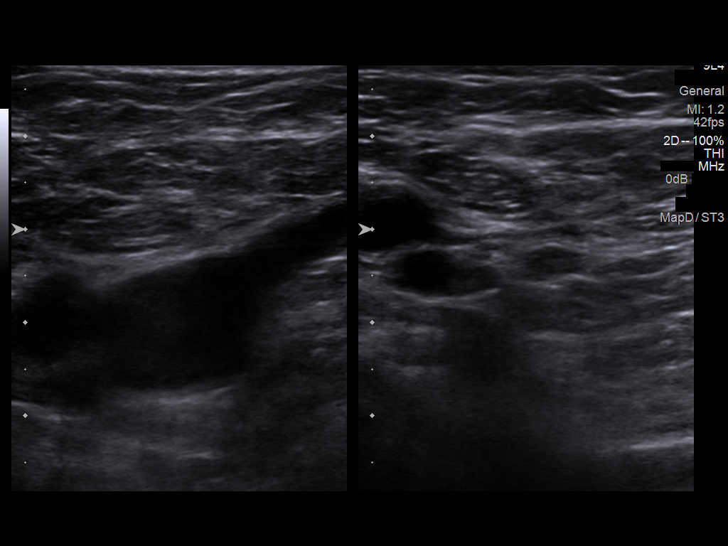
[im 6/69]
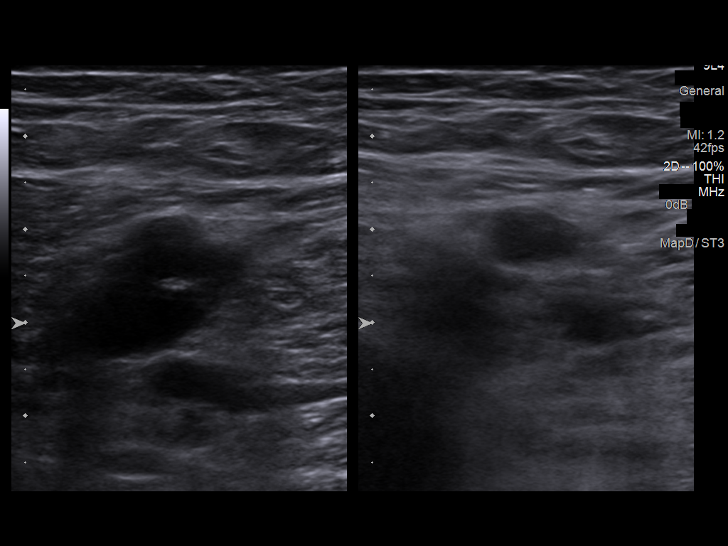
[im 12/69]
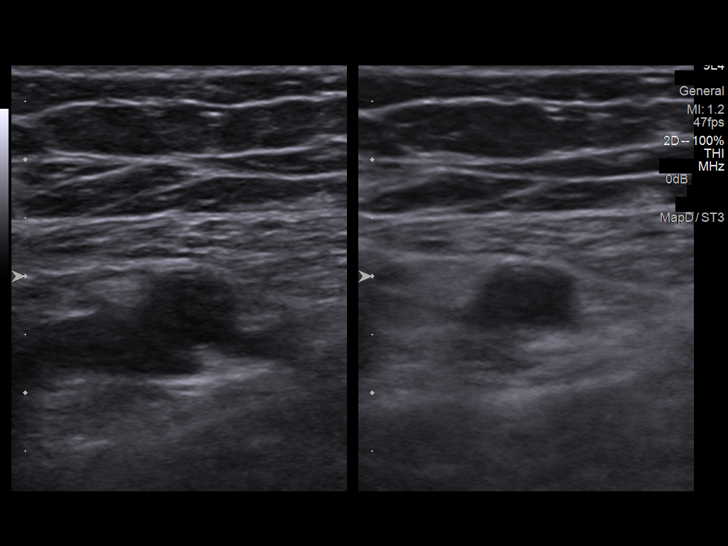
[im 18/69]
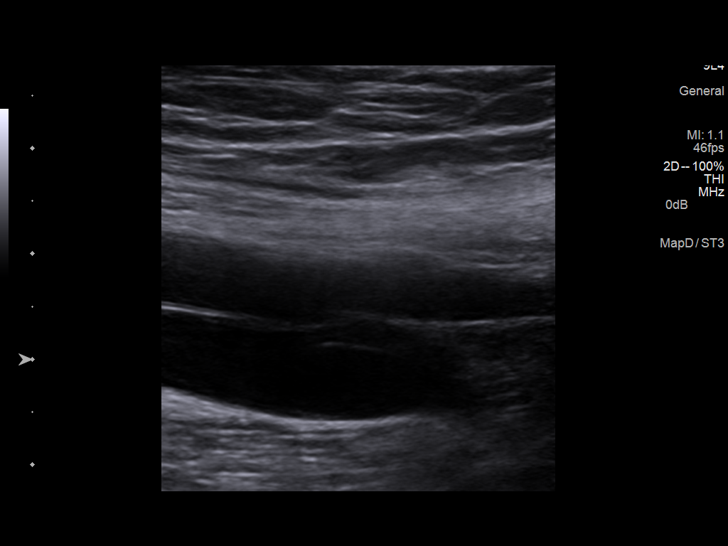
[im 24/69]
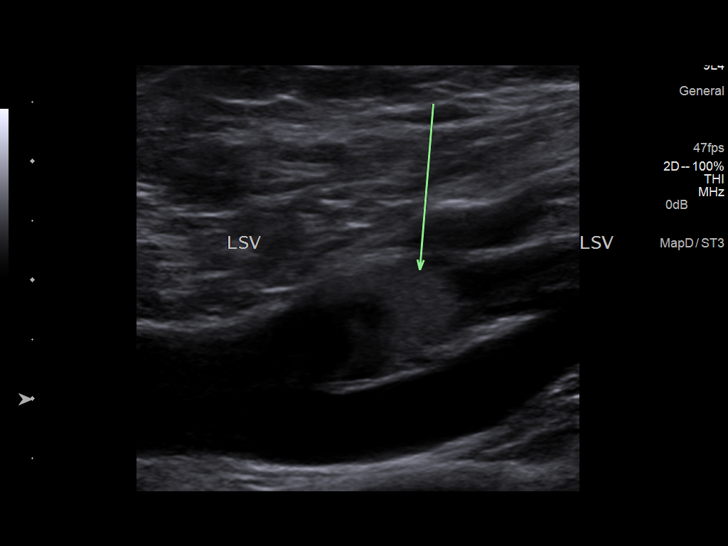
[im 30/69]
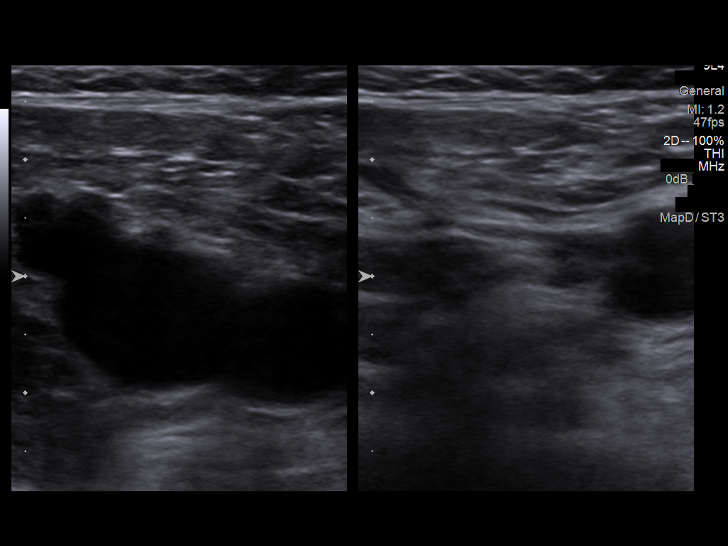
[im 36/69]
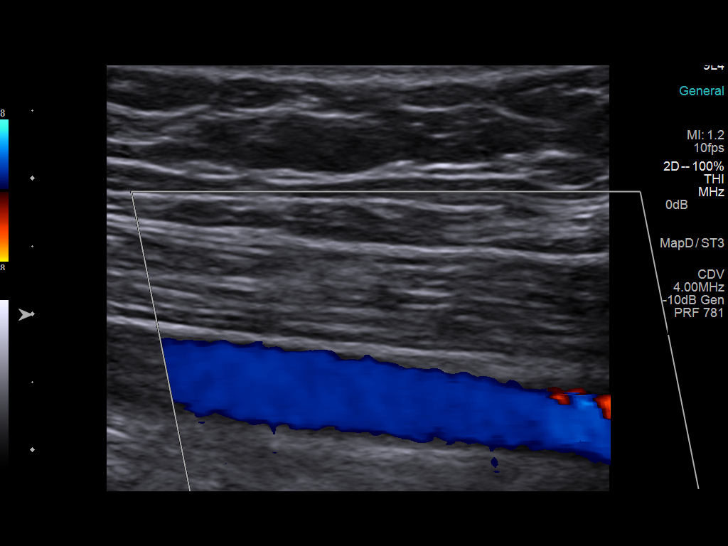
[im 39/69]
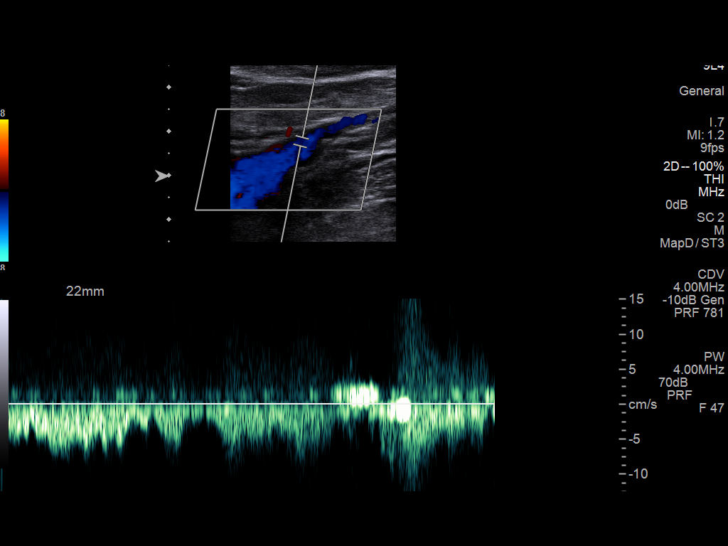
[im 45/69]
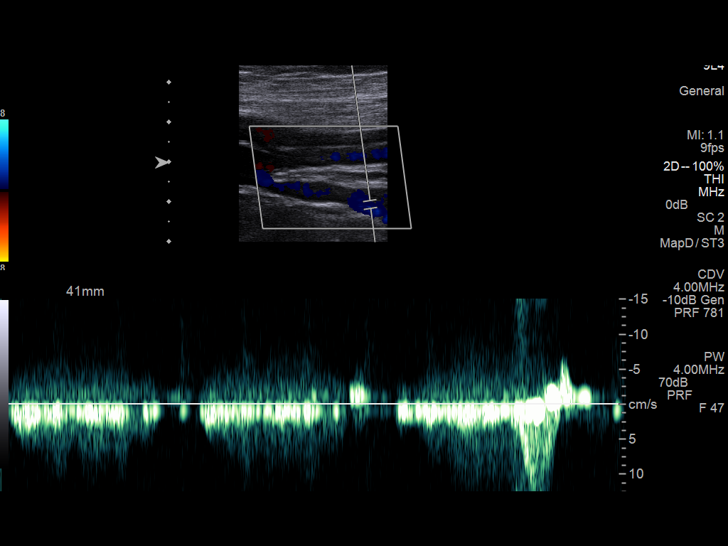
[im 51/69]
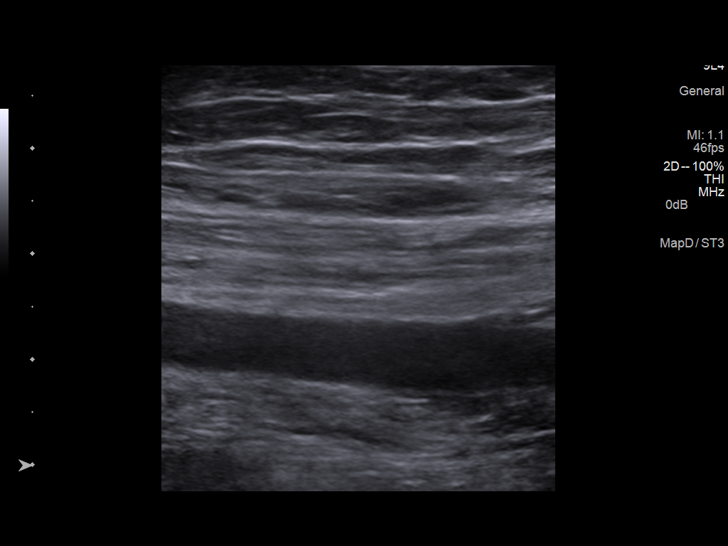
[im 57/69]
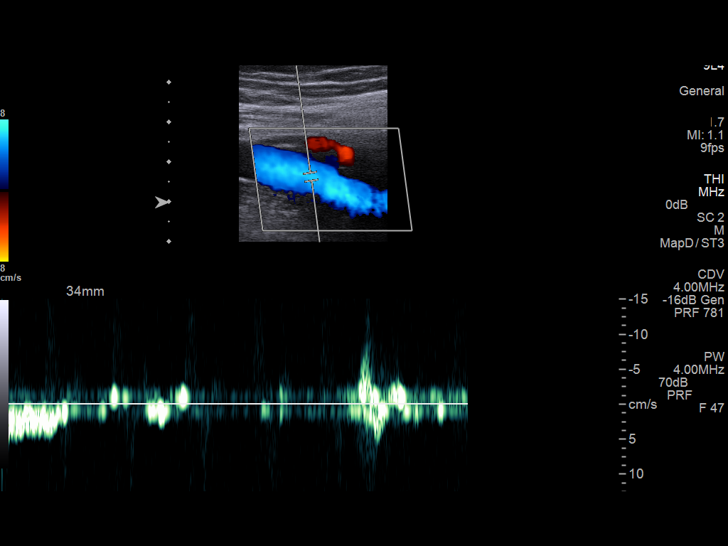
[im 63/69]
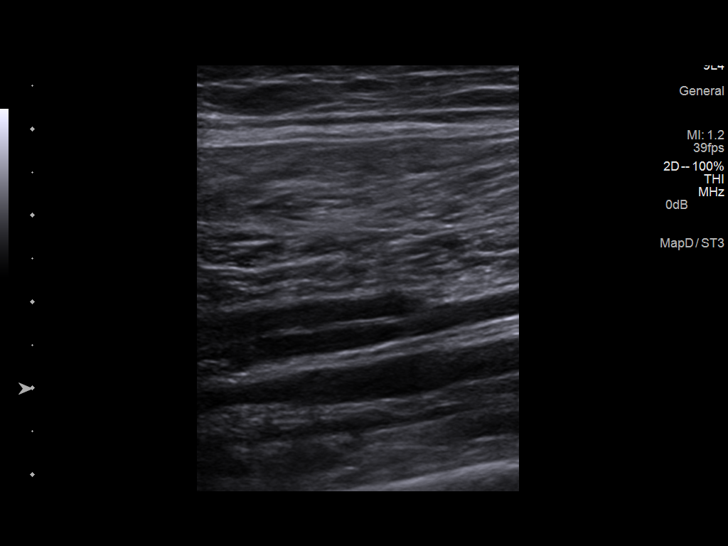
[im 69/69]
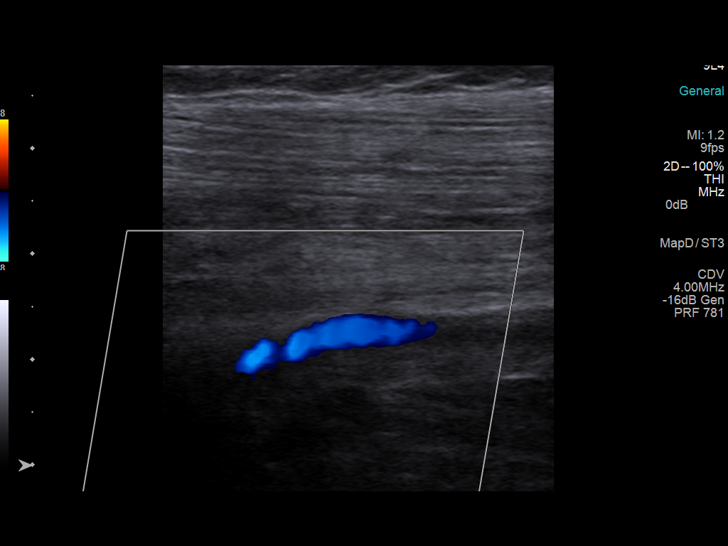

[13 of 24 positions shown; findings below may reference images not displayed]

FINDINGS: Thrombus within deep veins: Echogenic filling defect noted within
the right popliteal vein. This is of relatively short length and is
incompletely occlusive. The remainder of the right lower extremity
veins are free from thrombus. No evidence of filling defect or
thrombus throughout the left lower extremity.

Compressibility of deep veins: Focally noncompressible right
popliteal vein. Otherwise, normal compressibility.

Duplex waveform respiratory phasicity:  Normal.

Duplex waveform response to augmentation:  Normal.

Venous reflux:  None visualized.

Other findings:  None visualized.
IMPRESSION: Positive for small volume acute DVT in the right popliteal vein.

These results were called by telephone at the time of interpretation
on 02/25/2013 at [DATE] to Dr. SHIANN NAPOLEON , who verbally
acknowledged these results.

## 2014-12-14 IMAGING — CT CT CHEST W/ CM
2 of 3 series · 15 of 36 positions shown, 18 images · IV contrast (Omnipaque 300)
Comparison: None

Correlation:  Chest radiograph 02/24/2013

CLINICAL DATA: Shortness of breath, hemoptysis, solitary pulmonary
nodule

EXAM:
CT CHEST WITH CONTRAST
TECHNIQUE: Multidetector CT imaging of the chest was performed during
intravenous contrast administration. Sagittal and coronal MPR images
reconstructed from axial data set.
CONTRAST:  80mL OMNIPAQUE IOHEXOL 300 MG/ML  SOLN

[Series 2: chestroutine 5.0 b40f · axial · 0.71mm/px · z∈[-349,-114]mm · 12 of 57 slices shown, 15 images]
[im 5/57  mediastinal]
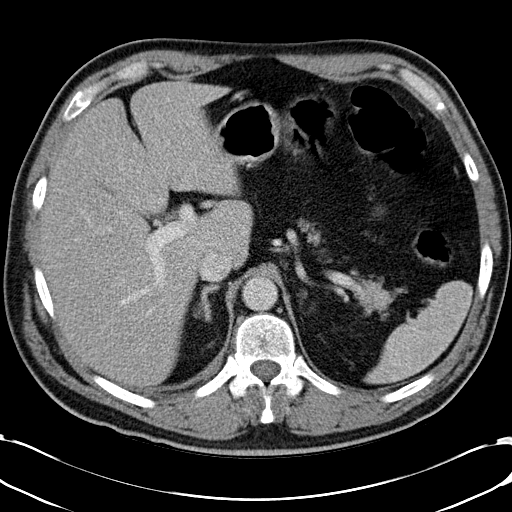
[im 5/57  lung]
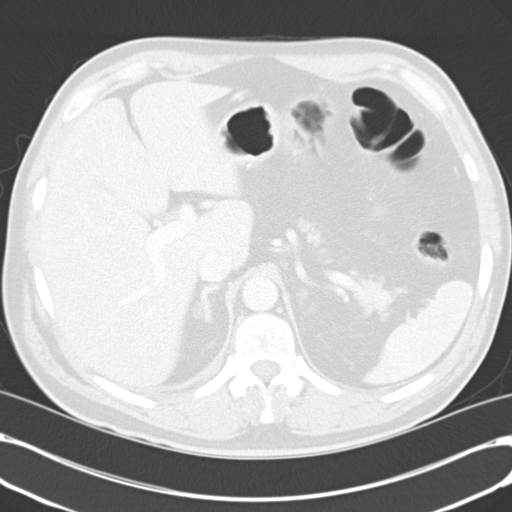
[im 9/57  lung]
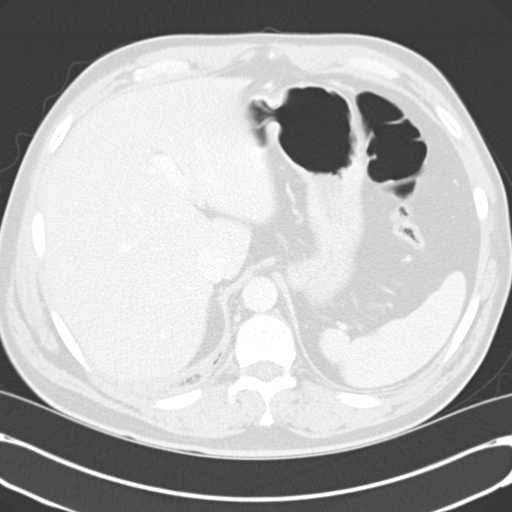
[im 13/57  lung]
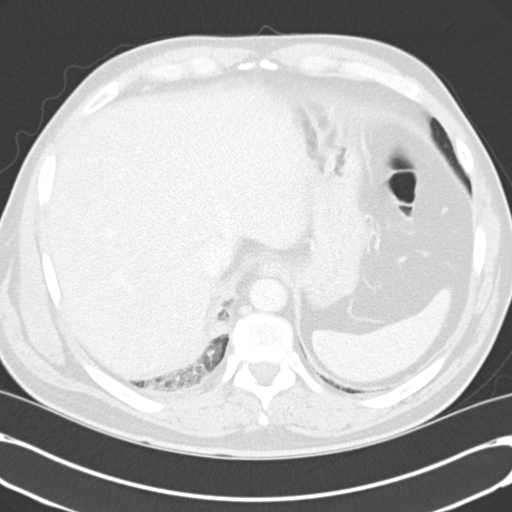
[im 17/57  lung]
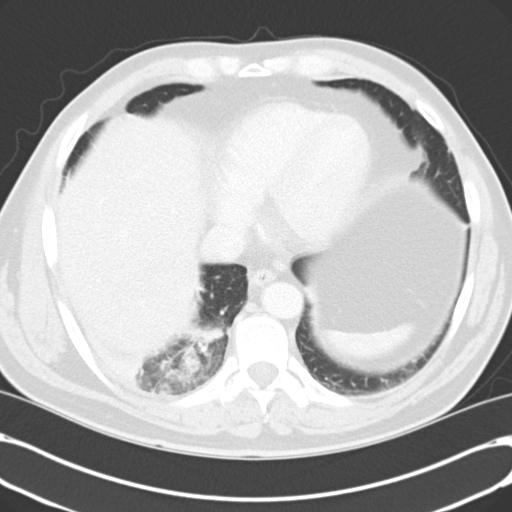
[im 21/57  mediastinal]
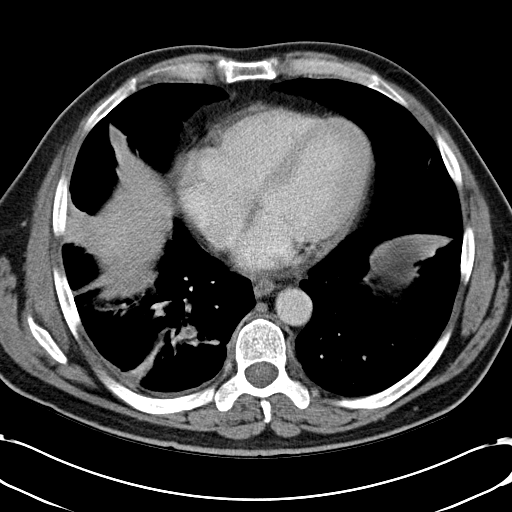
[im 21/57  lung]
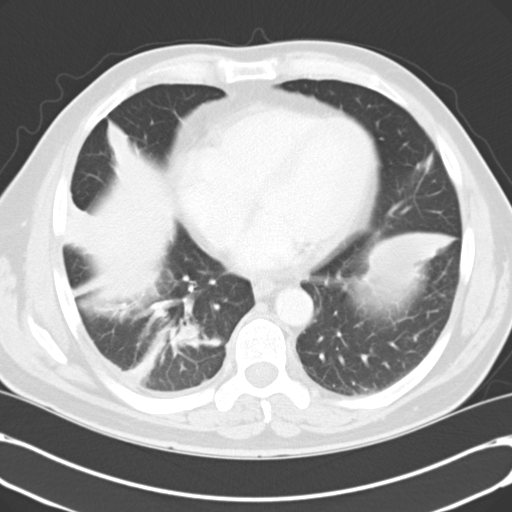
[im 25/57  lung]
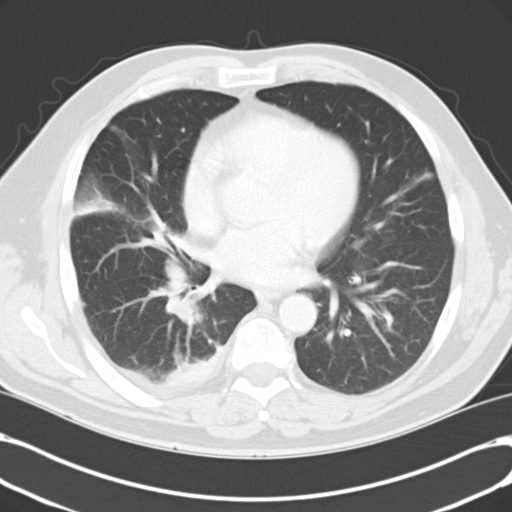
[im 32/57  lung]
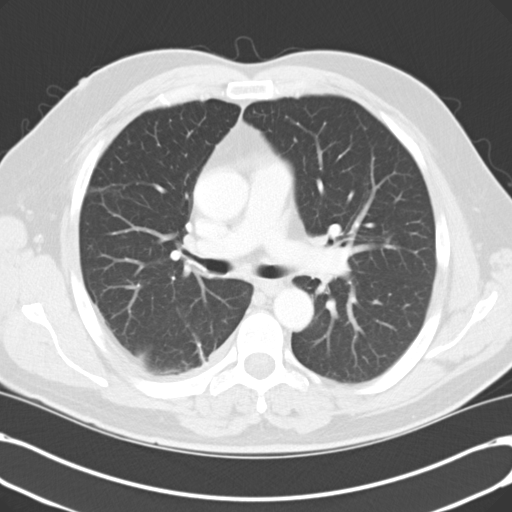
[im 36/57  lung]
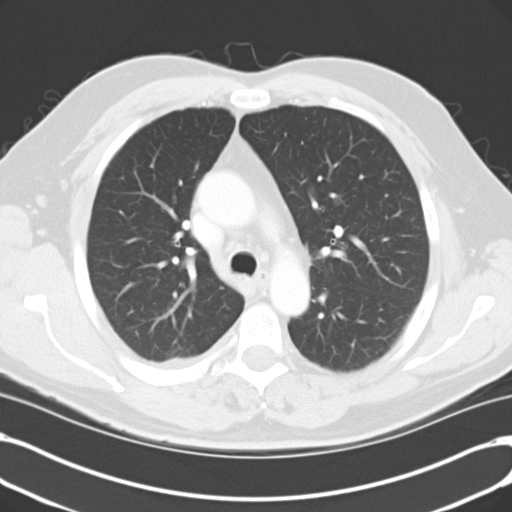
[im 40/57  mediastinal]
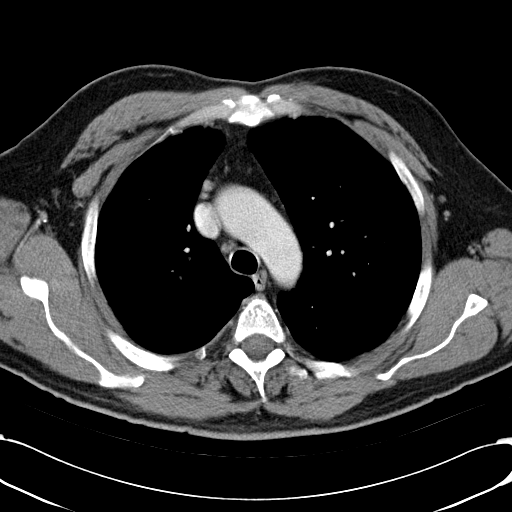
[im 40/57  lung]
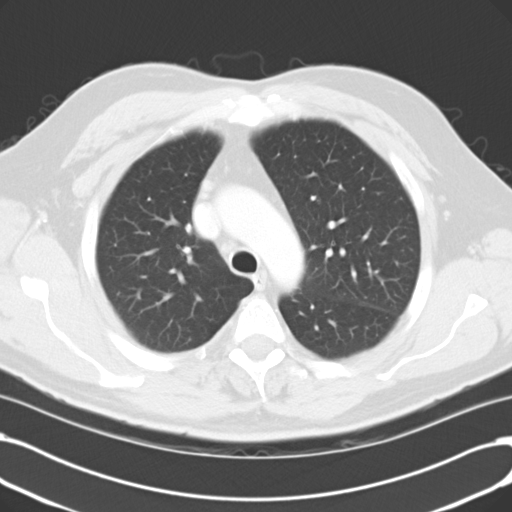
[im 44/57  lung]
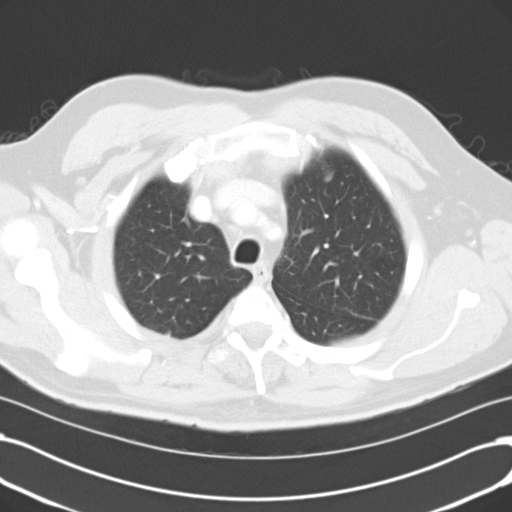
[im 48/57  lung]
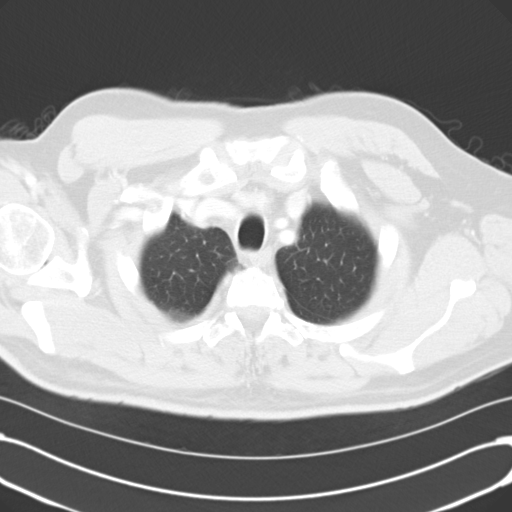
[im 52/57  lung]
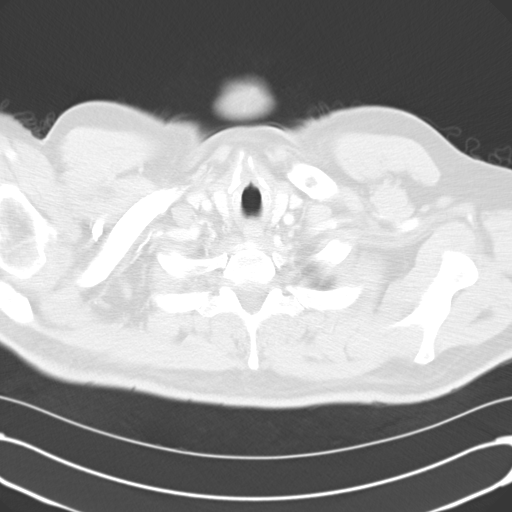

[Series 4: mpr coronal chest 3mm · coronal · 0.57mm/px · 3 of 92 slices shown]
[im 19/92  lung]
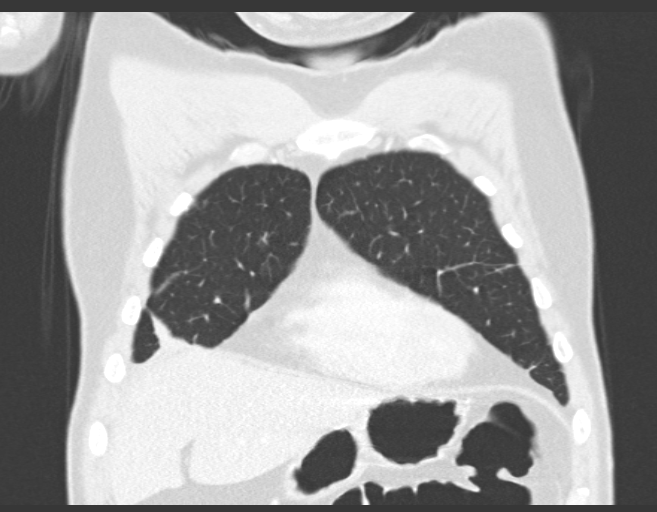
[im 37/92  lung]
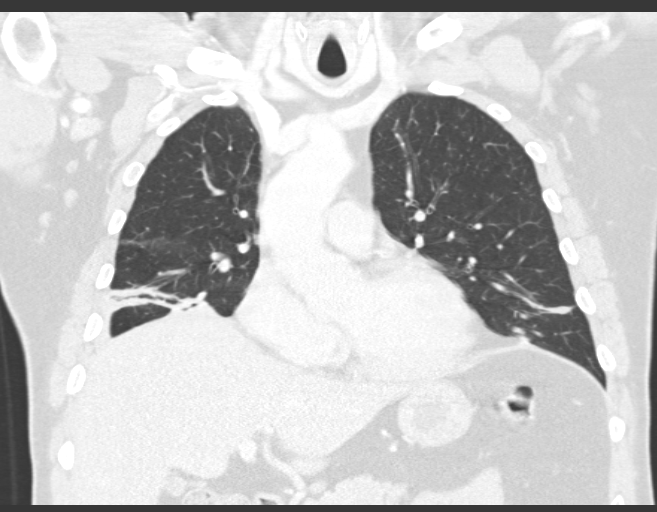
[im 55/92  lung]
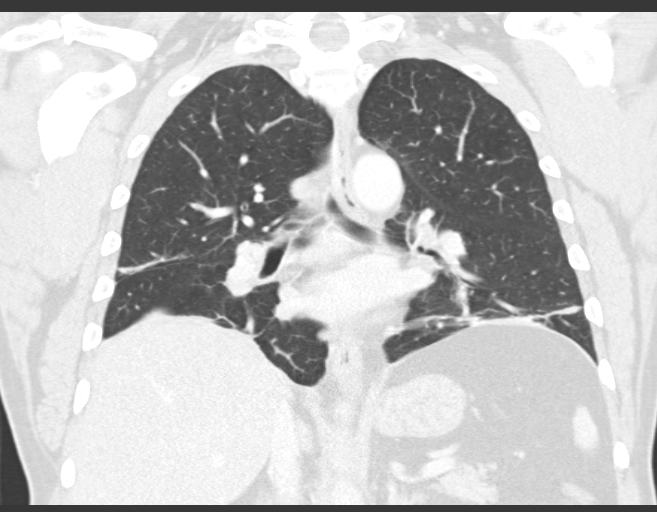

[15 of 36 positions shown; findings below may reference images not displayed]

FINDINGS: Aorta normal caliber, grossly normal appearance.

Despite exam not being performed with CTA technique, a filling
defect is identified in the right lower lobe pulmonary artery
consistent with pulmonary embolism.

RV:LV ratio of 0.89 (42.8mm/48.3mm)

Normal size mediastinal and axillary lymph nodes identified.

Questionable tiny cyst at upper pole of left kidney 11 mm diameter
image 57, incompletely imaged.

Remaining visualized portions of upper abdomen unremarkable.

Tiny right pleural effusion.

Right basilar atelectasis.

Minimal atelectasis at left base.

No pneumothorax or acute osseous findings.
IMPRESSION: Right lower lobe pulmonary embolus.

Right basilar atelectasis and small right pleural effusion.

Critical Value/emergent results were called by telephone at the time
of interpretation on 02/25/2013 at 4347 hr to HARY EKO MADUNALAENA PA,
who verbally acknowledged these results.

## 2014-12-31 ENCOUNTER — Ambulatory Visit (INDEPENDENT_AMBULATORY_CARE_PROVIDER_SITE_OTHER): Payer: Medicaid Other | Admitting: Otolaryngology

## 2014-12-31 DIAGNOSIS — H95121 Granulation of postmastoidectomy cavity, right ear: Secondary | ICD-10-CM | POA: Diagnosis not present

## 2015-04-29 ENCOUNTER — Ambulatory Visit (INDEPENDENT_AMBULATORY_CARE_PROVIDER_SITE_OTHER): Payer: Medicaid Other | Admitting: Otolaryngology

## 2015-05-27 ENCOUNTER — Ambulatory Visit (INDEPENDENT_AMBULATORY_CARE_PROVIDER_SITE_OTHER): Payer: Medicaid Other | Admitting: Otolaryngology

## 2015-05-27 DIAGNOSIS — H95121 Granulation of postmastoidectomy cavity, right ear: Secondary | ICD-10-CM | POA: Diagnosis not present

## 2015-09-30 ENCOUNTER — Ambulatory Visit (INDEPENDENT_AMBULATORY_CARE_PROVIDER_SITE_OTHER): Payer: Medicaid Other | Admitting: Otolaryngology

## 2015-09-30 DIAGNOSIS — H95121 Granulation of postmastoidectomy cavity, right ear: Secondary | ICD-10-CM

## 2016-02-07 ENCOUNTER — Ambulatory Visit (INDEPENDENT_AMBULATORY_CARE_PROVIDER_SITE_OTHER): Payer: Medicaid Other | Admitting: Otolaryngology

## 2016-02-07 DIAGNOSIS — H7011 Chronic mastoiditis, right ear: Secondary | ICD-10-CM | POA: Diagnosis not present

## 2016-06-05 ENCOUNTER — Ambulatory Visit (INDEPENDENT_AMBULATORY_CARE_PROVIDER_SITE_OTHER): Payer: Medicaid Other | Admitting: Otolaryngology

## 2016-06-05 DIAGNOSIS — H7011 Chronic mastoiditis, right ear: Secondary | ICD-10-CM | POA: Diagnosis not present

## 2016-10-02 ENCOUNTER — Ambulatory Visit (INDEPENDENT_AMBULATORY_CARE_PROVIDER_SITE_OTHER): Payer: Medicaid Other | Admitting: Otolaryngology

## 2016-10-02 DIAGNOSIS — H7011 Chronic mastoiditis, right ear: Secondary | ICD-10-CM

## 2017-02-05 ENCOUNTER — Ambulatory Visit (INDEPENDENT_AMBULATORY_CARE_PROVIDER_SITE_OTHER): Payer: Self-pay | Admitting: Otolaryngology

## 2017-02-22 ENCOUNTER — Ambulatory Visit (INDEPENDENT_AMBULATORY_CARE_PROVIDER_SITE_OTHER): Payer: Medicaid Other | Admitting: Otolaryngology

## 2017-02-22 DIAGNOSIS — H7011 Chronic mastoiditis, right ear: Secondary | ICD-10-CM

## 2017-05-10 ENCOUNTER — Encounter (INDEPENDENT_AMBULATORY_CARE_PROVIDER_SITE_OTHER): Payer: Self-pay | Admitting: *Deleted

## 2017-05-18 ENCOUNTER — Encounter (INDEPENDENT_AMBULATORY_CARE_PROVIDER_SITE_OTHER): Payer: Self-pay | Admitting: *Deleted

## 2017-06-25 ENCOUNTER — Ambulatory Visit (INDEPENDENT_AMBULATORY_CARE_PROVIDER_SITE_OTHER): Payer: Medicaid Other | Admitting: Otolaryngology

## 2017-06-25 DIAGNOSIS — H7011 Chronic mastoiditis, right ear: Secondary | ICD-10-CM

## 2017-10-22 ENCOUNTER — Ambulatory Visit (INDEPENDENT_AMBULATORY_CARE_PROVIDER_SITE_OTHER): Payer: Medicaid Other | Admitting: Otolaryngology

## 2017-10-22 DIAGNOSIS — H7011 Chronic mastoiditis, right ear: Secondary | ICD-10-CM

## 2018-02-18 ENCOUNTER — Ambulatory Visit (INDEPENDENT_AMBULATORY_CARE_PROVIDER_SITE_OTHER): Payer: Medicaid Other | Admitting: Otolaryngology

## 2018-02-18 DIAGNOSIS — H7011 Chronic mastoiditis, right ear: Secondary | ICD-10-CM | POA: Diagnosis not present

## 2021-08-04 ENCOUNTER — Emergency Department (HOSPITAL_COMMUNITY)
Admission: EM | Admit: 2021-08-04 | Discharge: 2021-08-04 | Disposition: A | Discharge status: Home / Self Care | Payer: Medicaid Other | Attending: Student | Admitting: Student

## 2021-08-04 ENCOUNTER — Encounter (HOSPITAL_COMMUNITY): Payer: Self-pay

## 2021-08-04 ENCOUNTER — Other Ambulatory Visit: Payer: Self-pay

## 2021-08-04 DIAGNOSIS — S50862A Insect bite (nonvenomous) of left forearm, initial encounter: Secondary | ICD-10-CM | POA: Diagnosis present

## 2021-08-04 DIAGNOSIS — W57XXXA Bitten or stung by nonvenomous insect and other nonvenomous arthropods, initial encounter: Secondary | ICD-10-CM | POA: Diagnosis not present

## 2021-08-04 DIAGNOSIS — Z7901 Long term (current) use of anticoagulants: Secondary | ICD-10-CM | POA: Diagnosis not present

## 2021-08-04 MED ORDER — DOXYCYCLINE HYCLATE 100 MG PO CAPS: 100.0000 mg | ORAL_CAPSULE | Freq: Two times a day (BID) | ORAL | 0 refills | Status: DC

## 2021-08-04 NOTE — ED Provider Notes (Signed)
[name]   CSN: [id number] Arrival date & time: 08/04/21  1239     History  Chief Complaint  Patient presents with   Insect Bite    [name] is a 61 y.o. male.  Reports to brown recluse bites to his arms patient reports he has been working with wood and saw the spider patient reports he has a bite on his right arm that is healing but has a bite on his left arm that is still swollen and red.  Patient denies any fever or chills he denies any redness or streaking patient has a past medical history of a DVT and a pulmonary embolus he is currently on Xarelto  The history is provided by the patient. No language interpreter was used.  Rash     Home Medications Prior to Admission medications   Medication Sig Start Date End Date Taking? Authorizing Provider  doxycycline (VIBRAMYCIN) 100 MG capsule Take 1 capsule (100 mg total) by mouth 2 (two) times daily. 08/04/21  Yes Elson Areas, PA-C  acetaminophen (TYLENOL) 500 MG tablet Take 500 mg by mouth every 6 (six) hours as needed.    [provider]  cyclobenzaprine (FLEXERIL) 10 MG tablet Take 1 tablet (10 mg total) by mouth 3 (three) times daily as needed. 04/23/14   Triplett, Tammy, PA-C  oxyCODONE-acetaminophen (PERCOCET/ROXICET) 5-325 MG per tablet Take 1 tablet by mouth every 4 (four) hours as needed. 04/23/14   Triplett, Tammy, PA-C  oxyCODONE-acetaminophen (PERCOCET/ROXICET) 5-325 MG per tablet Take 1 tablet by mouth every 4 (four) hours as needed. 04/23/14   Triplett, Tammy, PA-C  Rivaroxaban (XARELTO) 20 MG TABS tablet Take 1 tablet (20 mg total) by mouth daily with supper. Start on 03/19/13 after finished with 15mg  dose 03/18/13   03/20/13, MD      Allergies    Poison sumac extract    Review of Systems   Review of Systems  Skin:  Positive for rash.  All other systems reviewed and are negative.  Physical Exam Updated Vital Signs BP (!) 160/115   Pulse 62   Temp 97.8 F (36.6  C) (Oral)   Resp 16   SpO2 97%  Physical Exam Vitals and nursing Bean reviewed.  Constitutional:      General: He is not in acute distress.    Appearance: He is well-developed.  HENT:     Head: Normocephalic and atraumatic.  Eyes:     Conjunctiva/sclera: Conjunctivae normal.  Cardiovascular:     Rate and Rhythm: Normal rate.     Heart sounds: No murmur heard. Pulmonary:     Effort: Pulmonary effort is normal. No respiratory distress.  Musculoskeletal:        General: No swelling.  Skin:    General: Skin is warm.     Capillary Refill: Capillary refill takes less than 2 seconds.     Comments: 1 cm red area right forearm with 3 mm dark center 1 cm swollen area left forearm no streaking   Neurological:     Mental Status: He is alert.  Psychiatric:        Mood and Affect: Mood normal.    ED Results / Procedures / Treatments   Labs (all labs ordered are listed, but only abnormal results are displayed) Labs Reviewed - No data to display  EKG None  Radiology No results found.  Procedures Procedures    Medications Ordered in ED Medications - No data to display  ED  Course/ Medical Decision Making/ A&P                           Medical Decision Making Risk Prescription drug management.   Patient counseled on possible wound infection.  Pt given rx for doxycycline         Final Clinical Impression(s) / ED Diagnoses Final diagnoses:  Insect bite of left forearm, initial encounter    Rx / DC Orders ED Discharge Orders          Ordered    doxycycline (VIBRAMYCIN) 100 MG capsule  2 times daily        08/04/21 1333          An After Visit Summary was printed and given to the patient.     Elson Areas, [place] 08/04/21 1341    Kommor, Wyn Forster, MD 08/05/21 [phone]

## 2021-08-04 NOTE — ED Triage Notes (Signed)
Pt reports that he has 2 spider bites for more than 1 week ago.  States that he caught the spider and that it was a brown recluse. Redness noted to areas and pt states they burn.   Pt is alert and oriented.  Resp even and unlabored.  Skin wamr and dry.

## 2021-08-04 NOTE — Discharge Instructions (Signed)
Return if any problems.

## 2022-12-28 ENCOUNTER — Encounter: Payer: Self-pay | Admitting: *Deleted

## 2022-12-28 ENCOUNTER — Ambulatory Visit: Payer: Medicaid Other | Admitting: Internal Medicine

## 2022-12-28 ENCOUNTER — Encounter: Payer: Self-pay | Admitting: Internal Medicine

## 2022-12-28 ENCOUNTER — Other Ambulatory Visit: Payer: Self-pay | Admitting: *Deleted

## 2022-12-28 VITALS — BP 135/99 | HR 88 | Temp 97.8°F | Ht 74.0 in | Wt 239.6 lb

## 2022-12-28 DIAGNOSIS — Z7901 Long term (current) use of anticoagulants: Secondary | ICD-10-CM

## 2022-12-28 DIAGNOSIS — Z1211 Encounter for screening for malignant neoplasm of colon: Secondary | ICD-10-CM

## 2022-12-28 DIAGNOSIS — K219 Gastro-esophageal reflux disease without esophagitis: Secondary | ICD-10-CM

## 2022-12-28 DIAGNOSIS — Z79899 Other long term (current) drug therapy: Secondary | ICD-10-CM | POA: Diagnosis not present

## 2022-12-28 DIAGNOSIS — Z86711 Personal history of pulmonary embolism: Secondary | ICD-10-CM

## 2022-12-28 MED ORDER — CLENPIQ 10-3.5-12 MG-GM -GM/175ML PO SOLN: 1.0000 | ORAL | 0 refills | Status: DC

## 2022-12-28 NOTE — Patient Instructions (Signed)
We will schedule you for colonoscopy for colon cancer screening purposes.  You will need to hold your Xarelto x 2 days prior to procedure.  It was very nice meeting you today.  Dr. Marletta Lor

## 2022-12-28 NOTE — Progress Notes (Signed)
Primary Care Physician:  Wilmon Pali, FNP Primary Gastroenterologist:  Dr. Marletta Lor  Chief Complaint  Patient presents with   New Patient (Initial Visit)    Ov before colonoscopy    HPI:   [name] is a 62 y.o. male who presents to the clinic today by referral from his PCP Coral Ceo for evaluation for screening colonoscopy.  No prior colonoscopy.  Notes his grandfather may have had colon cancer.  Otherwise family history unremarkable.  Denies any melena hematochezia.  No unintentional weight loss.  No abdominal pain.  Chronically on Xarelto x 10 years for pulmonary embolism 2014 as well as right lower extremity DVT.  States this is related to a broken right leg years ago.  Has not seen hematology in 9 years though they did recommend lifelong anticoagulation.  Does have mild reflux.  States this happens a few times a year.  Symptoms occur primarily in the middle night he will have to wake up and drink water.  No dysphagia odynophagia.  No epigastric or chest pain.  Past Medical History:  Diagnosis Date   chronic deep vein thrombosis (DVT) of popliteal vein of right lower extremity 10/06/2013   PE (pulmonary thromboembolism) (HCC) 2009, 02/2013   PNA (pneumonia) 2009   Shingles     Past Surgical History:  Procedure Laterality Date   HAND SURGERY Left    INCISION AND DRAINAGE OF WOUND  07/06/2011   Procedure: IRRIGATION AND DEBRIDEMENT WOUND;  Surgeon: Tami Ribas, MD;  Location: [facility] OR;  Service: Orthopedics;  Laterality: Left;   KNEE SURGERY Right     Current Outpatient Medications  Medication Sig Dispense Refill   acetaminophen (TYLENOL) 500 MG tablet Take 500 mg by mouth every 6 (six) hours as needed.     Rivaroxaban (XARELTO) 20 MG TABS tablet Take 1 tablet (20 mg total) by mouth daily with supper. Start on 03/19/13 after finished with 15mg  dose 30 tablet 1   cyclobenzaprine (FLEXERIL) 10 MG tablet Take 1 tablet (10 mg total) by mouth 3 (three) times daily as  needed. (Patient not taking: Reported on 12/28/2022) 21 tablet 0   doxycycline (VIBRAMYCIN) 100 MG capsule Take 1 capsule (100 mg total) by mouth 2 (two) times daily. (Patient not taking: Reported on 12/28/2022) 20 capsule 0   oxyCODONE-acetaminophen (PERCOCET/ROXICET) 5-325 MG per tablet Take 1 tablet by mouth every 4 (four) hours as needed. (Patient not taking: Reported on 12/28/2022) 6 tablet 0   oxyCODONE-acetaminophen (PERCOCET/ROXICET) 5-325 MG per tablet Take 1 tablet by mouth every 4 (four) hours as needed. (Patient not taking: Reported on 12/28/2022) 10 tablet 0   No current facility-administered medications for this visit.    Allergies as of 12/28/2022 - Review Complete 12/28/2022  Allergen Reaction Noted   Poison sumac extract  07/02/2013    Family History  Problem Relation Age of Onset   Clotting disorder Father     Social History   Socioeconomic History   Marital status: Divorced    Spouse name: Not on file   Number of children: Not on file   Years of education: Not on file   Highest education level: Not on file  Occupational History   Not on file  Tobacco Use   Smoking status: Former    Current packs/day: 0.00    Types: Cigarettes    Quit date: 11/20/1983    Years since quitting: 39.1   Smokeless tobacco: Never  Substance and Sexual Activity   Alcohol use: No  Drug use: No   Sexual activity: Not on file  Other Topics Concern   Not on file  Social History Narrative   Not on file   Social Determinants of Health   Financial Resource Strain: Not on file  Food Insecurity: Not on file  Transportation Needs: Not on file  Physical Activity: Not on file  Stress: Not on file  Social Connections: Not on file  Intimate Partner Violence: Not on file    Subjective: Review of Systems  Constitutional:  Negative for chills and fever.  HENT:  Negative for congestion and hearing loss.   Eyes:  Negative for blurred vision and double vision.  Respiratory:  Negative  for cough and shortness of breath.   Cardiovascular:  Negative for chest pain and palpitations.  Gastrointestinal:  Negative for abdominal pain, blood in stool, constipation, diarrhea, heartburn, melena and vomiting.  Genitourinary:  Negative for dysuria and urgency.  Musculoskeletal:  Negative for joint pain and myalgias.  Skin:  Negative for itching and rash.  Neurological:  Negative for dizziness and headaches.  Psychiatric/Behavioral:  Negative for depression. The patient is not nervous/anxious.        Objective: BP (!) 149/99   Pulse 88   Temp 97.8 F (36.6 C)   Ht 6\' 2"  (1.88 m)   Wt 239 lb 9.6 oz (108.7 kg)   BMI 30.76 kg/m  Physical Exam Constitutional:      Appearance: Normal appearance.  HENT:     Head: Normocephalic and atraumatic.  Eyes:     Extraocular Movements: Extraocular movements intact.     Conjunctiva/sclera: Conjunctivae normal.  Cardiovascular:     Rate and Rhythm: Normal rate and regular rhythm.  Pulmonary:     Effort: Pulmonary effort is normal.     Breath sounds: Normal breath sounds.  Abdominal:     General: Bowel sounds are normal.     Palpations: Abdomen is soft.  Musculoskeletal:        General: Normal range of motion.     Cervical back: Normal range of motion and neck supple.  Skin:    General: Skin is warm.  Neurological:     General: No focal deficit present.     Mental Status: He is alert and oriented to person, place, and time.  Psychiatric:        Mood and Affect: Mood normal.        Behavior: Behavior normal.      Assessment: *Colon cancer screening *High risk medication use *Chronic systemic anticoagulation *History of PE/DVT *GERD-mild intermittent  Plan: Will schedule for screening colonoscopy.The risks including infection, bleed, or perforation as well as benefits, limitations, alternatives and imponderables have been reviewed with the patient. Questions have been answered. All parties agreeable.  Patient will need  to hold his Xarelto x 2 days prior to procedure.  Counseled on the slight increase cardiovascular risk during this time and he is agreeable.  GERD symptoms very mild, intermittent, okay to use over-the-counter famotidine as needed.  No alarm symptoms today to warrant further investigation with upper endoscopy.  Thank you Coral Ceo for the kind referral  12/28/2022 2:03 PM   Disclaimer: This note was dictated with voice recognition software. Similar sounding words can inadvertently be transcribed and may not be corrected upon review.

## 2023-01-25 ENCOUNTER — Encounter (HOSPITAL_COMMUNITY)
Admission: RE | Admit: 2023-01-25 | Discharge: 2023-01-25 | Disposition: A | Discharge status: Home / Self Care | Payer: Medicaid Other | Source: Ambulatory Visit | Attending: Internal Medicine | Admitting: Internal Medicine

## 2023-01-30 ENCOUNTER — Ambulatory Visit (HOSPITAL_COMMUNITY): Payer: Medicaid Other | Admitting: Anesthesiology

## 2023-01-30 ENCOUNTER — Encounter (HOSPITAL_COMMUNITY): Payer: Self-pay

## 2023-01-30 ENCOUNTER — Ambulatory Visit (HOSPITAL_COMMUNITY)
Admission: RE | Admit: 2023-01-30 | Discharge: 2023-01-30 | Disposition: A | Discharge status: Home / Self Care | Payer: Medicaid Other | Attending: Internal Medicine | Admitting: Internal Medicine

## 2023-01-30 ENCOUNTER — Encounter (HOSPITAL_COMMUNITY)
Admission: RE | Disposition: A | Discharge status: Home / Self Care | Payer: Self-pay | Source: Home / Self Care | Attending: Internal Medicine

## 2023-01-30 DIAGNOSIS — Z1211 Encounter for screening for malignant neoplasm of colon: Secondary | ICD-10-CM | POA: Insufficient documentation

## 2023-01-30 DIAGNOSIS — D123 Benign neoplasm of transverse colon: Secondary | ICD-10-CM | POA: Diagnosis not present

## 2023-01-30 DIAGNOSIS — K635 Polyp of colon: Secondary | ICD-10-CM

## 2023-01-30 DIAGNOSIS — D126 Benign neoplasm of colon, unspecified: Secondary | ICD-10-CM

## 2023-01-30 DIAGNOSIS — K219 Gastro-esophageal reflux disease without esophagitis: Secondary | ICD-10-CM | POA: Insufficient documentation

## 2023-01-30 DIAGNOSIS — D127 Benign neoplasm of rectosigmoid junction: Secondary | ICD-10-CM | POA: Insufficient documentation

## 2023-01-30 DIAGNOSIS — D124 Benign neoplasm of descending colon: Secondary | ICD-10-CM | POA: Insufficient documentation

## 2023-01-30 DIAGNOSIS — D128 Benign neoplasm of rectum: Secondary | ICD-10-CM

## 2023-01-30 DIAGNOSIS — Z86711 Personal history of pulmonary embolism: Secondary | ICD-10-CM | POA: Insufficient documentation

## 2023-01-30 DIAGNOSIS — K5730 Diverticulosis of large intestine without perforation or abscess without bleeding: Secondary | ICD-10-CM | POA: Insufficient documentation

## 2023-01-30 DIAGNOSIS — K648 Other hemorrhoids: Secondary | ICD-10-CM | POA: Insufficient documentation

## 2023-01-30 DIAGNOSIS — Z87891 Personal history of nicotine dependence: Secondary | ICD-10-CM | POA: Insufficient documentation

## 2023-01-30 DIAGNOSIS — Z7901 Long term (current) use of anticoagulants: Secondary | ICD-10-CM | POA: Insufficient documentation

## 2023-01-30 HISTORY — PX: POLYPECTOMY: SHX5525

## 2023-01-30 HISTORY — PX: COLONOSCOPY WITH PROPOFOL: SHX5780

## 2023-01-30 SURGERY — COLONOSCOPY WITH PROPOFOL
Anesthesia: General

## 2023-01-30 MED ORDER — PROPOFOL 500 MG/50ML IV EMUL
INTRAVENOUS | Status: DC | PRN
  Administered 2023-01-30: 200 ug/kg/min via INTRAVENOUS

## 2023-01-30 MED ORDER — LACTATED RINGERS IV SOLN: INTRAVENOUS | Status: DC | PRN

## 2023-01-30 MED ORDER — STERILE WATER FOR IRRIGATION IR SOLN
Status: DC | PRN
  Administered 2023-01-30: 60 mL

## 2023-01-30 MED ORDER — PROPOFOL 10 MG/ML IV BOLUS
INTRAVENOUS | Status: DC | PRN
  Administered 2023-01-30: 100 mg via INTRAVENOUS

## 2023-01-30 NOTE — H&P (Signed)
Primary Care Physician:  Wilmon Pali, FNP Primary Gastroenterologist:  Dr. Marletta Lor  Pre-Procedure History & Physical: HPI:  [name] is a 62 y.o. male is here for a colonoscopy for colon cancer screening purposes.   Past Medical History:  Diagnosis Date   chronic deep vein thrombosis (DVT) of popliteal vein of right lower extremity 10/06/2013   PE (pulmonary thromboembolism) (HCC) 2009, 02/2013   PNA (pneumonia) 03/21/2007   Shingles     Past Surgical History:  Procedure Laterality Date   HAND SURGERY Left    INCISION AND DRAINAGE OF WOUND  07/06/2011   Procedure: IRRIGATION AND DEBRIDEMENT WOUND;  Surgeon: Tami Ribas, MD;  Location: [facility] OR;  Service: Orthopedics;  Laterality: Left;   KNEE SURGERY Right     Prior to Admission medications   Medication Sig Start Date End Date Taking? Authorizing Provider  acetaminophen (TYLENOL) 500 MG tablet Take 500 mg by mouth every 6 (six) hours as needed.   Yes [provider]  Sod Picosulfate-Mag Ox-Cit Acd (CLENPIQ) 10-3.5-12 MG-GM -GM/175ML SOLN Take 1 kit by mouth as directed. 12/28/22  Yes Robey Massmann, Hennie Duos, DO  Rivaroxaban (XARELTO) 20 MG TABS tablet Take 1 tablet (20 mg total) by mouth daily with supper. Start on 03/19/13 after finished with 15mg  dose 03/18/13   Catarina Hartshorn, MD    Allergies as of 12/28/2022 - Review Complete 12/28/2022  Allergen Reaction Noted   Poison sumac extract  07/02/2013    Family History  Problem Relation Age of Onset   Clotting disorder Father     Social History   Socioeconomic History   Marital status: Divorced    Spouse name: Not on file   Number of children: Not on file   Years of education: Not on file   Highest education level: Not on file  Occupational History   Not on file  Tobacco Use   Smoking status: Former    Current packs/day: 0.00    Types: Cigarettes    Quit date: 11/20/1983    Years since quitting: 39.2   Smokeless tobacco: Never  Substance and Sexual Activity    Alcohol use: No   Drug use: No   Sexual activity: Not on file  Other Topics Concern   Not on file  Social History Narrative   Not on file   Social Determinants of Health   Financial Resource Strain: Not on file  Food Insecurity: Not on file  Transportation Needs: Not on file  Physical Activity: Not on file  Stress: Not on file  Social Connections: Not on file  Intimate Partner Violence: Not on file    Review of Systems: See HPI, otherwise negative ROS  Physical Exam: Vital signs in last 24 hours: Temp:  [98.3 F (36.8 C)] 98.3 F (36.8 C) (11/12 0934) Pulse Rate:  [57] 57 (11/12 0934) Resp:  [18] 18 (11/12 0934) BP: (155)/(100) 155/100 (11/12 0934) SpO2:  [98 %] 98 % (11/12 0934)   General:   Alert,  Well-developed, well-nourished, pleasant and cooperative in NAD Head:  Normocephalic and atraumatic. Eyes:  Sclera clear, no icterus.   Conjunctiva pink. Ears:  Normal auditory acuity. Nose:  No deformity, discharge,  or lesions. Msk:  Symmetrical without gross deformities. Normal posture. Extremities:  Without clubbing or edema. Neurologic:  Alert and  oriented x4;  grossly normal neurologically. Skin:  Intact without significant lesions or rashes. Psych:  Alert and cooperative. Normal mood and affect.  Impression/Plan: [name] is here for a colonoscopy  to be performed for colon cancer screening purposes.  The risks of the procedure including infection, bleed, or perforation as well as benefits, limitations, alternatives and imponderables have been reviewed with the patient. Questions have been answered. All parties agreeable.

## 2023-01-30 NOTE — Discharge Instructions (Signed)
  Colonoscopy Discharge Instructions  Read the instructions outlined below and refer to this sheet in the next few weeks. These discharge instructions provide you with general information on caring for yourself after you leave the hospital. Your doctor may also give you specific instructions. While your treatment has been planned according to the most current medical practices available, unavoidable complications occasionally occur.   ACTIVITY You may resume your regular activity, but move at a slower pace for the next 24 hours.  Take frequent rest periods for the next 24 hours.  Walking will help get rid of the air and reduce the bloated feeling in your belly (abdomen).  No driving for 24 hours (because of the medicine (anesthesia) used during the test).   Do not sign any important legal documents or operate any machinery for 24 hours (because of the anesthesia used during the test).  NUTRITION Drink plenty of fluids.  You may resume your normal diet as instructed by your doctor.  Begin with a Oha[place] meal and progress to your normal diet. Heavy or fried foods are harder to digest and may make you feel sick to your stomach (nauseated).  Avoid alcoholic beverages for 24 hours or as instructed.  MEDICATIONS You may resume your normal medications unless your doctor tells you otherwise.  WHAT YOU CAN EXPECT TODAY Some feelings of bloating in the abdomen.  Passage of more gas than usual.  Spotting of blood in your stool or on the toilet paper.  IF YOU HAD POLYPS REMOVED DURING THE COLONOSCOPY: No aspirin products for 7 days or as instructed.  No alcohol for 7 days or as instructed.  Eat a soft diet for the next 24 hours.  FINDING OUT THE RESULTS OF YOUR TEST Not all test results are available during your visit. If your test results are not back during the visit, make an appointment with your caregiver to find out the results. Do not assume everything is normal if you have not heard from your  caregiver or the medical facility. It is important for you to follow up on all of your test results.  SEEK IMMEDIATE MEDICAL ATTENTION IF: You have more than a spotting of blood in your stool.  Your belly is swollen (abdominal distention).  You are nauseated or vomiting.  You have a temperature over 101.  You have abdominal pain or discomfort that is severe or gets worse throughout the day.   Your colonoscopy revealed 5 polyp(s) which I removed successfully. Await pathology results, my office will contact you. I recommend repeating colonoscopy in 3 years for surveillance purposes.   You also have diverticulosis and internal hemorrhoids. I would recommend increasing fiber in your diet or adding OTC Benefiber/Metamucil. Be sure to drink at least 4 to 6 glasses of water daily. Follow-up with GI as needed.  Restart Xarelto tomorrow.  I hope you have a great rest of your week!  Hennie Duos. Marletta Lor, D.O. Gastroenterology and Hepatology [facility]

## 2023-01-30 NOTE — Transfer of Care (Signed)
Immediate Anesthesia Transfer of Care Note  Patient: [name]  Procedure(s) Performed: COLONOSCOPY WITH PROPOFOL POLYPECTOMY  Patient Location: Short Stay  Anesthesia Type:General  Level of Consciousness: awake, alert , oriented, and patient cooperative  Airway & Oxygen Therapy: Patient Spontanous Breathing  Post-op Assessment: Report given to RN, Post -op Vital signs reviewed and stable, and Patient moving all extremities X 4  Post vital signs: Reviewed and stable  Last Vitals:  Vitals Value Taken Time  BP 94/78 01/30/23 1045  Temp 36.6 C 01/30/23 1045  Pulse 56 01/30/23 1045  Resp 20 01/30/23 1045  SpO2 94 % 01/30/23 1045    Last Pain:  Vitals:   01/30/23 1045  TempSrc: Oral  PainSc: 0-No pain      Patients Stated Pain Goal: 5 (01/30/23 0934)  Complications: No notable events documented.

## 2023-01-30 NOTE — Anesthesia Postprocedure Evaluation (Signed)
Anesthesia Post Note  Patient: [name]  Procedure(s) Performed: COLONOSCOPY WITH PROPOFOL POLYPECTOMY  Patient location during evaluation: PACU Anesthesia Type: General Level of consciousness: awake and alert Pain management: pain level controlled Vital Signs Assessment: post-procedure vital signs reviewed and stable Respiratory status: spontaneous breathing, nonlabored ventilation, respiratory function stable and patient connected to nasal cannula oxygen Cardiovascular status: blood pressure returned to baseline and stable Postop Assessment: no apparent nausea or vomiting Anesthetic complications: no   There were no known notable events for this encounter.   Last Vitals:  Vitals:   01/30/23 1045 01/30/23 1052  BP: 94/78 (!) 113/90  Pulse: (!) 56   Resp: 20   Temp: 36.6 C   SpO2: 94%     Last Pain:  Vitals:   01/30/23 1045  TempSrc: Oral  PainSc: 0-No pain                 Dilan Fullenwider L Ozell Juhasz

## 2023-01-30 NOTE — Op Note (Signed)
[facility] Patient Name: [name] Procedure Date: 01/30/2023 10:01 AM MRN: [id number] Date of Birth: 11/29/1960 Attending MD: Hennie Duos. Marletta Lor , [place], [id number] CSN: [id number] Age: 62 Admit Type: Outpatient Procedure:                Colonoscopy Indications:              Screening for colorectal malignant neoplasm Providers:                Hennie Duos. Marletta Lor, DO, Angelica Ran, Elinor Parkinson Referring MD:              Medicines:                See the Anesthesia note for documentation of the                            administered medications Complications:            No immediate complications. Estimated Blood Loss:     Estimated blood loss was minimal. Procedure:                Pre-Anesthesia Assessment:                           - The anesthesia plan was to use monitored                            anesthesia care (MAC).                           After obtaining informed consent, the colonoscope                            was passed under direct vision. Throughout the                            procedure, the patient's blood pressure, pulse, and                            oxygen saturations were monitored continuously. The                            [id number]-[id number] ([id number]) was introduced through the                            anus and advanced to the the cecum, identified by                            appendiceal orifice and ileocecal valve. The                            colonoscopy was performed without difficulty. The                            patient tolerated the procedure well. The quality                            of  the bowel preparation was evaluated using the                            BBPS [facility] Bowel Preparation Scale) with scores                            of: Right Colon = 2 (minor amount of residual                            staining, small fragments of stool and/or opaque                            liquid, but mucosa seen well), Transverse Colon = 3                             (entire mucosa seen well with no residual staining,                            small fragments of stool or opaque liquid) and Left                            Colon = 3 (entire mucosa seen well with no residual                            staining, small fragments of stool or opaque                            liquid). The total BBPS score equals 8. The quality                            of the bowel preparation was good. Scope In: 10:23:08 AM Scope Out: 10:39:19 AM Scope Withdrawal Time: 0 hours 14 minutes 4 seconds  Total Procedure Duration: 0 hours 16 minutes 11 seconds  Findings:      Non-bleeding internal hemorrhoids were found during endoscopy.      A few large-mouthed and small-mouthed diverticula were found in the       sigmoid colon.      A 12 mm polyp was found in the transverse colon. The polyp was sessile.       The polyp was removed with a cold snare. Resection and retrieval were       complete.      Two sessile polyps were found in the transverse colon. The polyps were 2       mm in size. These polyps were removed with a cold biopsy forceps.       Resection and retrieval were complete.      A 2 mm polyp was found in the descending colon. The polyp was sessile.       The polyp was removed with a cold biopsy forceps. Resection and       retrieval were complete.      A 15 mm polyp was found in the rectum. The polyp was pedunculated. The       polyp was removed with a hot snare. Resection and retrieval were  complete.      The exam was otherwise without abnormality. Impression:               - Non-bleeding internal hemorrhoids.                           - Diverticulosis in the sigmoid colon.                           - One 12 mm polyp in the transverse colon, removed                            with a cold snare. Resected and retrieved.                           - Two 2 mm polyps in the transverse colon, removed                            with a cold  biopsy forceps. Resected and retrieved.                           - One 2 mm polyp in the descending colon, removed                            with a cold biopsy forceps. Resected and retrieved.                           - One 15 mm polyp in the rectum, removed with a hot                            snare. Resected and retrieved.                           - The examination was otherwise normal. Moderate Sedation:      Per Anesthesia Care Recommendation:           - Patient has a contact number available for                            emergencies. The signs and symptoms of potential                            delayed complications were discussed with the                            patient. Return to normal activities tomorrow.                            Written discharge instructions were provided to the                            patient.                           - Resume previous diet.                           -  Continue present medications.                           - Await pathology results.                           - Repeat colonoscopy in 3 years for surveillance.                           - Return to GI clinic PRN. Procedure Code(s):        --- Professional ---                           [phone], Colonoscopy, flexible; with removal of                            tumor(s), polyp(s), or other lesion(s) by snare                            technique                           45380, 59, Colonoscopy, flexible; with biopsy,                            single or multiple Diagnosis Code(s):        --- Professional ---                           Z12.11, Encounter for screening for malignant                            neoplasm of colon                           K64.8, Other hemorrhoids                           D12.3, Benign neoplasm of transverse colon (hepatic                            flexure or splenic flexure)                           D12.4, Benign neoplasm of descending colon                            D12.8, Benign neoplasm of rectum                           K57.30, Diverticulosis of large intestine without                            perforation or abscess without bleeding CPT copyright 2022 American [employer]. All rights reserved. The codes documented in this report are preliminary and upon coder review may  be revised to meet current compliance requirements. Hennie Duos. Marletta Lor, DO Hennie Duos. Marletta Lor, DO  01/30/2023 10:43:04 AM This report has been signed electronically. Number of Addenda: 0

## 2023-01-30 NOTE — Anesthesia Preprocedure Evaluation (Addendum)
Anesthesia Evaluation  Patient identified by MRN, date of birth, ID band Patient awake    Reviewed: Allergy & Precautions, H&P , NPO status , Patient's Chart, lab work & pertinent test results  Airway Mallampati: I  TM Distance: >3 FB Neck ROM: Full    Dental no notable dental hx. (+) Dental Advisory Given, Chipped, Teeth Intact,    Pulmonary Patient abstained from smoking., former smoker History PE   Pulmonary exam normal breath sounds clear to auscultation       Cardiovascular negative cardio ROS Normal cardiovascular exam Rhythm:Regular Rate:Normal     Neuro/Psych negative neurological ROS     GI/Hepatic Neg liver ROS,GERD  Poorly Controlled,,  Endo/Other  negative endocrine ROS    Renal/GU negative Renal ROS     Musculoskeletal  (+) Arthritis ,    Abdominal  (+) + obese  Peds  Hematology negative hematology ROS (+)   Anesthesia Other Findings   Reproductive/Obstetrics                             Anesthesia Physical Anesthesia Plan  ASA: 2 and emergent  Anesthesia Plan: General   Post-op Pain Management:    Induction: Intravenous  PONV Risk Score and Plan:   Airway Management Planned: Nasal Cannula and Natural Airway  Additional Equipment: None  Intra-op Plan:   Post-operative Plan: Extubation in OR  Informed Consent: I have reviewed the patients History and Physical, chart, labs and discussed the procedure including the risks, benefits and alternatives for the proposed anesthesia with the patient or authorized representative who has indicated his/her understanding and acceptance.     Dental advisory given  Plan Discussed with: CRNA  Anesthesia Plan Comments: (Plan routine monitors, GETA)        Anesthesia Quick Evaluation

## 2023-01-31 LAB — SURGICAL PATHOLOGY

## 2023-02-05 ENCOUNTER — Encounter (HOSPITAL_COMMUNITY): Payer: Self-pay | Admitting: Internal Medicine

## 2023-02-08 ENCOUNTER — Telehealth: Payer: Self-pay | Admitting: Internal Medicine

## 2023-02-08 NOTE — Telephone Encounter (Signed)
Patient had a medium size polyp in his rectum which I removed.  This is a very vascular area.  His intermittent rectal bleeding could be coming from post polypectomy site as it heals.  If it is just a mild amount of blood here and there, I think it is okay to continue to watch and it should slowly improve.  If he has profuse bleeding, would recommend he present to the emergency room for further evaluation.  If bleeding continues through the weekend, please let him know to call us and let us know on Monday.  Thank you

## 2023-02-09 NOTE — Telephone Encounter (Signed)
FYI:  Dr Marletta Lor  Phoned and advised the pt of his result note / instructions / follow up if needed and to go to the ED if needed. Pt expressed he hasn't had anymore problems since that first day of the colonoscopy. Pt expressed understanding to the instructions

## 2023-06-27 ENCOUNTER — Other Ambulatory Visit (HOSPITAL_COMMUNITY): Payer: Self-pay | Admitting: Family Medicine

## 2023-06-27 DIAGNOSIS — R7989 Other specified abnormal findings of blood chemistry: Secondary | ICD-10-CM

## 2023-07-13 ENCOUNTER — Ambulatory Visit (HOSPITAL_COMMUNITY)
Admission: RE | Admit: 2023-07-13 | Discharge: 2023-07-13 | Disposition: A | Discharge status: Home / Self Care | Source: Ambulatory Visit | Attending: Family Medicine | Admitting: Family Medicine

## 2023-07-13 DIAGNOSIS — R7989 Other specified abnormal findings of blood chemistry: Secondary | ICD-10-CM | POA: Diagnosis present
